# Patient Record
Sex: Female | Born: 1975 | Race: White | Hispanic: No | Marital: Married | Smoking: Never smoker
Health system: Southern US, Community
[De-identification: ages and names within clinical notes are randomized; demographics above are authoritative.]

## PROBLEM LIST (undated history)

## (undated) DIAGNOSIS — Z789 Other specified health status: Secondary | ICD-10-CM

---

## 2003-02-02 ENCOUNTER — Inpatient Hospital Stay (HOSPITAL_COMMUNITY): Admission: AD | Admit: 2003-02-02 | Discharge: 2003-02-05 | Payer: Self-pay | Admitting: Obstetrics and Gynecology

## 2003-03-16 ENCOUNTER — Other Ambulatory Visit: Admission: RE | Admit: 2003-03-16 | Discharge: 2003-03-16 | Payer: Self-pay | Admitting: Obstetrics and Gynecology

## 2010-12-05 ENCOUNTER — Inpatient Hospital Stay (HOSPITAL_COMMUNITY): Admission: AD | Admit: 2010-12-05 | Payer: Self-pay | Source: Home / Self Care | Admitting: Obstetrics and Gynecology

## 2010-12-09 ENCOUNTER — Inpatient Hospital Stay (HOSPITAL_COMMUNITY): Admission: AD | Admit: 2010-12-09 | Payer: Self-pay | Source: Ambulatory Visit | Admitting: Obstetrics and Gynecology

## 2010-12-09 ENCOUNTER — Inpatient Hospital Stay (HOSPITAL_COMMUNITY)
Admission: AD | Admit: 2010-12-09 | Discharge: 2010-12-10 | DRG: 373 | Disposition: A | Discharge status: Home / Self Care | Payer: BC Managed Care – PPO | Source: Ambulatory Visit | Attending: Obstetrics and Gynecology | Admitting: Obstetrics and Gynecology

## 2010-12-09 DIAGNOSIS — O480 Post-term pregnancy: Principal | ICD-10-CM | POA: Diagnosis present

## 2010-12-09 LAB — RPR: RPR Ser Ql: NONREACTIVE

## 2010-12-09 LAB — CBC
HCT: 35.1 % — ABNORMAL LOW (ref 36.0–46.0)
Hemoglobin: 12.1 g/dL (ref 12.0–15.0)
RBC: 3.89 MIL/uL (ref 3.87–5.11)
WBC: 9.7 10*3/uL (ref 4.0–10.5)

## 2010-12-09 LAB — ABO/RH: ABO/RH(D): O POS

## 2010-12-10 LAB — CBC
HCT: 31 % — ABNORMAL LOW (ref 36.0–46.0)
Hemoglobin: 10.5 g/dL — ABNORMAL LOW (ref 12.0–15.0)
MCHC: 33.9 g/dL (ref 30.0–36.0)
RBC: 3.38 MIL/uL — ABNORMAL LOW (ref 3.87–5.11)
WBC: 13.7 10*3/uL — ABNORMAL HIGH (ref 4.0–10.5)

## 2010-12-15 NOTE — H&P (Signed)
  NAMESUZANNAH, Colleen Sanchez                 ACCOUNT NO.:  [id number]  MEDICAL RECORD NO.:  [id number]           PATIENT TYPE:  I  LOCATION:  9127                          FACILITY:  [facility]  PHYSICIAN:  Lenoard Aden, M.D.DATE OF BIRTH:  1976-01-30  DATE OF ADMISSION:  12/09/2010 DATE OF DISCHARGE:                             HISTORY & PHYSICAL   INDICATION FOR INDUCTION:  Postdate, she is a 35 year old Caucasian female G3, P1-0-1-1 at 40-4/7 weeks' gestation who presents for induction.  She has no known drug allergies.  Medications are prenatal vitamins.  She is a nonsmoker and nondrinker.  She denies domestic or physical violence.  She has a personal history of PMDD.  SOCIAL HISTORY:  Noncontributory.  FAMILY HISTORY:  Remarkable for hypertension, uterine cancer, heart disease, diabetes, and lung cancer.  PREVIOUS PREGNANCIES:  Remarkable for SAB in 2003 and then spontaneous vaginal delivery of a 6 pounds 15 ounces female fetus in 2004.  Prenatal course complicated by size discrepancy with AGA fetus on ultrasound, GBS was performed, was negative.  PHYSICAL EXAMINATION:  GENERAL:  She is a well-developed, well-nourished white female in no acute distress. HEENT:  Normal. LUNGS:  Clear. HEART:  Regular rhythm. ABDOMEN:  Soft, gravid, nontender.  Estimated fetal weight 7 pounds. Cervix is 3-4 cm, 80%, vertex -1. EXTREMITIES:  There were no cords. NEUROLOGIC:  Nonfocal. SKIN:  Intact.  NST is reactive.  IMPRESSION:  A 40 and 4/7-week intrauterine pregnancy for induction of labor.  PLAN:  Pitocin epidural as needed, anticipate attempts at vaginal delivery.     Lenoard Aden, M.D.     RJT/MEDQ  D:  12/09/2010  T:  12/09/2010  Job:  [id number]  Electronically Signed by Olivia Mackie M.D. on 12/15/2010 11:14:43 AM

## 2011-03-09 NOTE — H&P (Signed)
NAME:  Colleen Sanchez, Colleen Sanchez                           ACCOUNT NO.:  [id number]   MEDICAL RECORD NO.:  [id number]                   PATIENT TYPE:  INP   LOCATION:  9162                                 FACILITY:  [facility]   PHYSICIAN:  Genia Del, M.D.             DATE OF BIRTH:  [birth year]   DATE OF ADMISSION:  02/02/2003  DATE OF DISCHARGE:                                HISTORY & PHYSICAL   IDENTIFICATION DATA:  The patient is a 35 year old G3, P0, A2, at [redacted] weeks  gestation.  Expected date of delivery is 02/02/03.   REASON FOR ADMISSION:  Suspected labor with regular intense uterine  contractions and very severe pelvic pressure.   HISTORY OF PRESENT ILLNESS:  The patient calls complaining of very intense  pain and pressure in her pelvis with probable uterine contractions.  No  vaginal bleeding, no fluid leak at that time.  Fetal movement, no PIH  symptoms.   PAST MEDICAL HISTORY:  Negative.   PAST SURGICAL HISTORY:  1. Positive for a D&E.  2. In 1998, 10 weeks therapeutic abortion with no complications.  3. In 4/03, 11 weeks spontaneous abortion of twins, no complications.   ALLERGIES:  No known drug allergies.   MEDICATIONS:  Prenatal vitamins.   SOCIAL HISTORY:  Married, nonsmoker.   FAMILY HISTORY:  Positive for cardiovascular disease, diverticulosis, and  chronic hypertension.   HISTORY OF PRESENT PREGNANCY:  First trimester was normal with labs showing  a hemoglobin of 12.7, platelets 245, O positive, antibodies negative, RPR  nonreactive, Hepatitis B surface antigen negative, HIV nonreactive, rubella  titer immune.  Gonorrhea and Chlamydia negative.  Pap was normal.  In the  second trimester she had a triple test which was within normal limits.  She  had an ultrasound revealing an estimated age of 67 weeks which was within  normal limits.  Placenta was anterior normal.  Amniotic fluid was normal.  In the third trimester one hour GTT was normal at 86.  Blood  pressures  remained normal throughout pregnancy.  Weight gain was adequate.  Group B  Strep was done at 35+ weeks and was negative.   REVIEW OF SYMPTOMS:  CONSTITUTIONAL:  Negative.  HEENT:  Negative.  CARDIOVASCULAR:  Negative.  RESPIRATORY:  Negative.  GASTROINTESTINAL:  Negative.  GENITOURINARY:  Negative.  NEUROLOGIC:  Negative.   PHYSICAL EXAMINATION:  GENERAL:  The patient is in no acute distress, but  severe pain with uterine contractions.  VITAL SIGNS:  Blood pressure 130/74,  pulse 80, respiratory rate 20, temperature 96.9.  LUNGS:  Clear bilaterally.  HEART:  Regular rate and rhythm.  ABDOMEN:  Gravid, regular presentation.  PELVIC:  Vaginal examination on admission was 8 to 9 cm, 90% effaced,  vertex, 0 to +1.  Membranes grossly ruptured.  EXTREMITIES:  Lower limbs  normal.   LABORATORY DATA:  Reassuring fetal heart rate in 130's with good  variability.  Mild variables with uterine contractions.  Uterine  contractions are every 1 minute, intense, lasting 60 to 70 seconds.   IMPRESSION:  G3, P0, A2 at [redacted] weeks gestation in continuous active phase of  labor.  Fetal heart rate being reassuring, Group B Strep negative.   PLAN:  Admit to labor and delivery, prepare for vacuum stage, fetal heart  rate monitoring.                                               Genia Del, M.D.    ML/MEDQ  D:  02/02/2003  T:  02/02/2003  Job:  [id number]

## 2012-08-31 ENCOUNTER — Emergency Department: Payer: Self-pay | Admitting: Internal Medicine

## 2012-08-31 LAB — BASIC METABOLIC PANEL
BUN: 16 mg/dL (ref 7–18)
Chloride: 108 mmol/L — ABNORMAL HIGH (ref 98–107)
EGFR (Non-African Amer.): >60
Glucose: 85 mg/dL (ref 65–99)
Potassium: 3.8 mmol/L (ref 3.5–5.1)
Sodium: 140 mmol/L (ref 136–145)

## 2012-08-31 LAB — CBC
HCT: 39.7 % (ref 35.0–47.0)
HGB: 13.9 g/dL (ref 12.0–16.0)
MCHC: 34.9 g/dL (ref 32.0–36.0)

## 2012-08-31 LAB — CK TOTAL AND CKMB (NOT AT ARMC)
CK, Total: 120 U/L (ref 21–215)
CK-MB: 1.1 ng/mL (ref 0.5–3.6)

## 2012-08-31 LAB — TROPONIN I: Troponin-I: <0.02 ng/mL

## 2012-08-31 LAB — TSH: Thyroid Stimulating Horm: 1.73 u[IU]/mL

## 2012-08-31 IMAGING — CR DG CHEST 2V
1 series · 3 of 3 positions shown · non-contrast
Comparison: none

REASON FOR EXAM: palpitations
COMMENTS:

PROCEDURE:     DXR - DXR CHEST PA (OR AP) AND LATERAL  - [DATE]  [DATE]
RESULT:     The lungs are hyperinflated and clear. The cardiac silhouette is
normal in size. The mediastinum is normal in width. There is no pleural
effusion. The bony thorax exhibits no acute abnormality.

[Series 1: pa · 0.17mm/px · 3 of 3 slices shown]
[im 1/3]
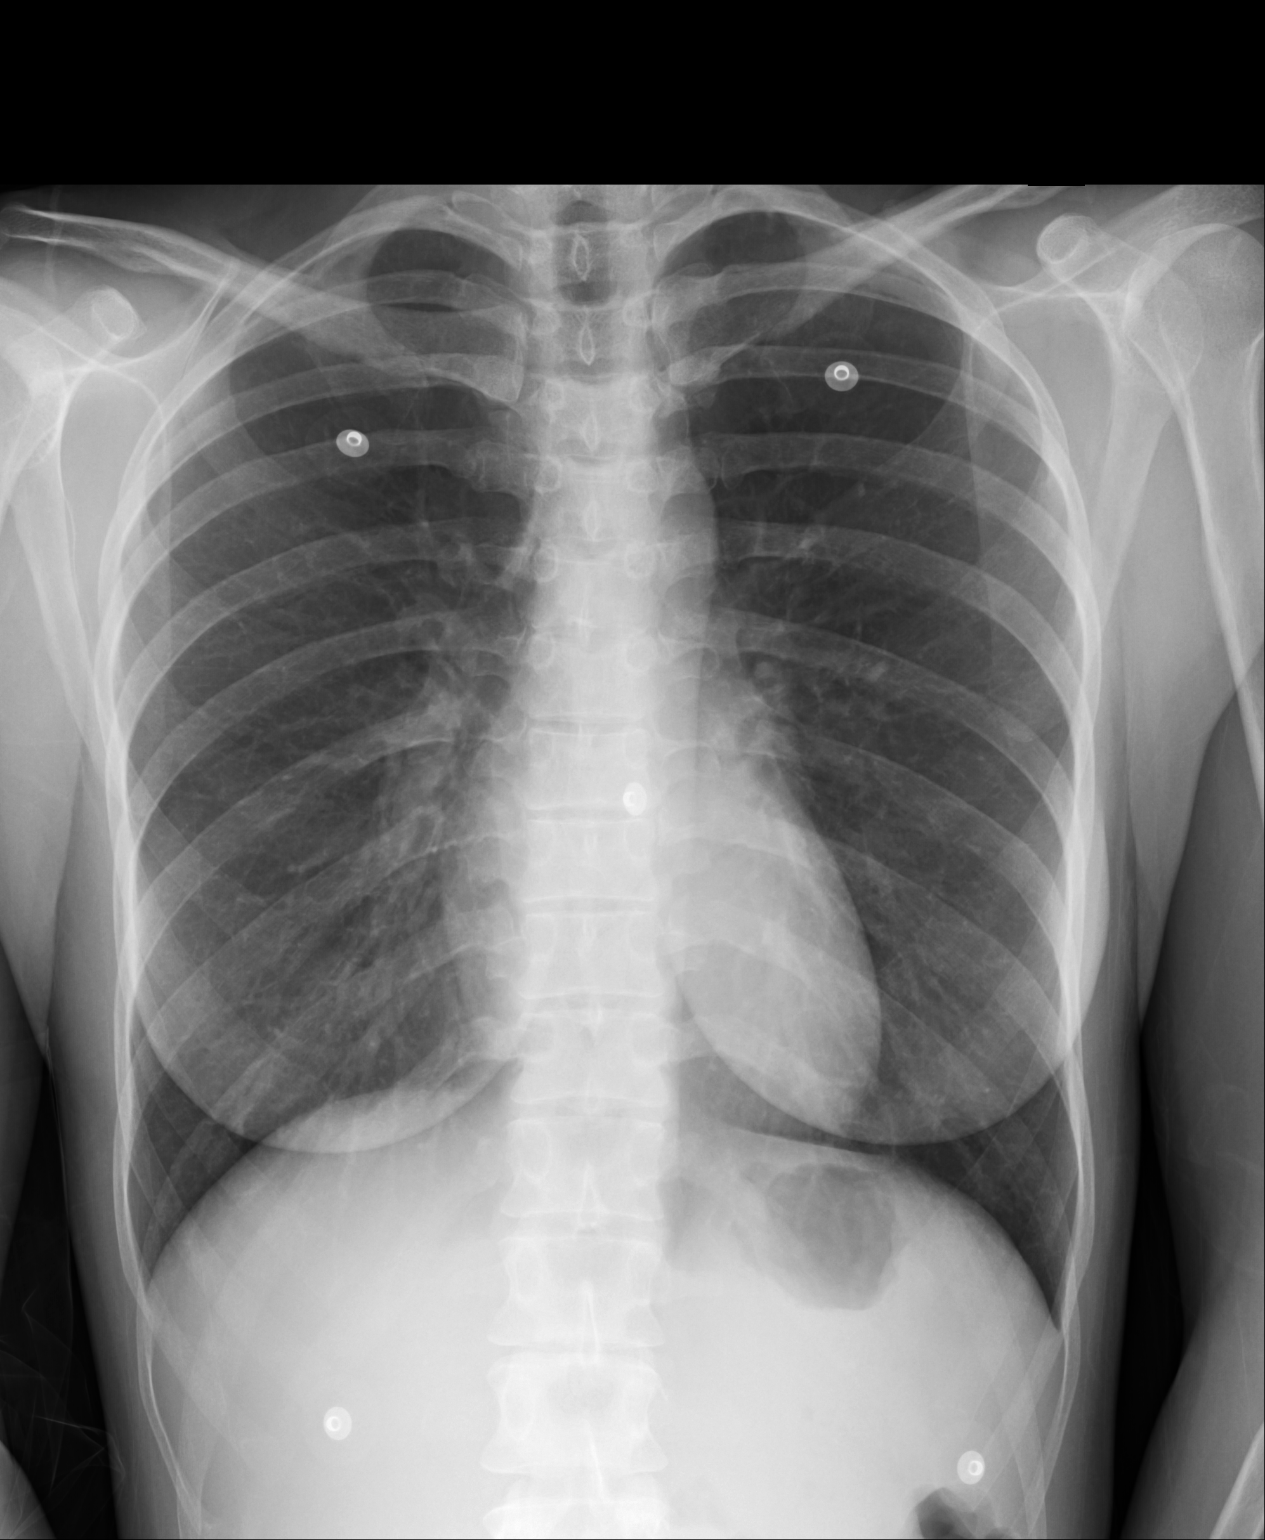
[im 2/3]
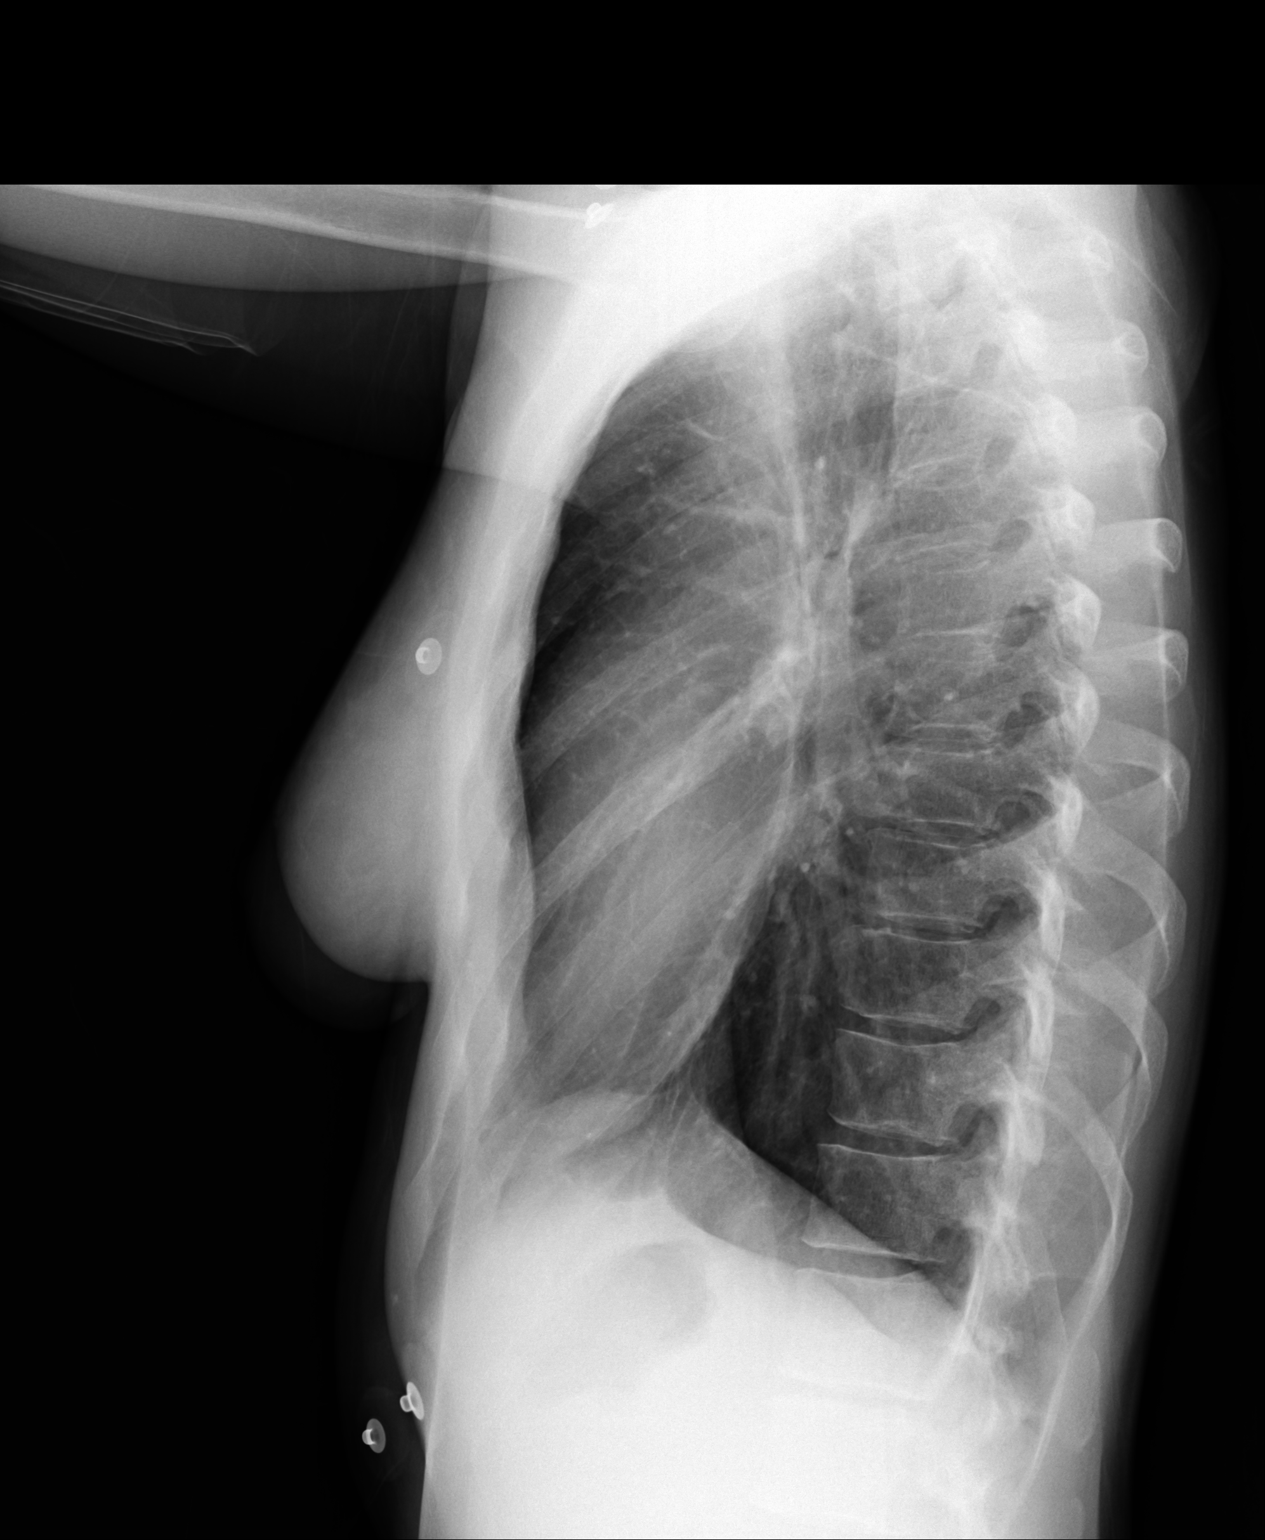
[im 3/3]
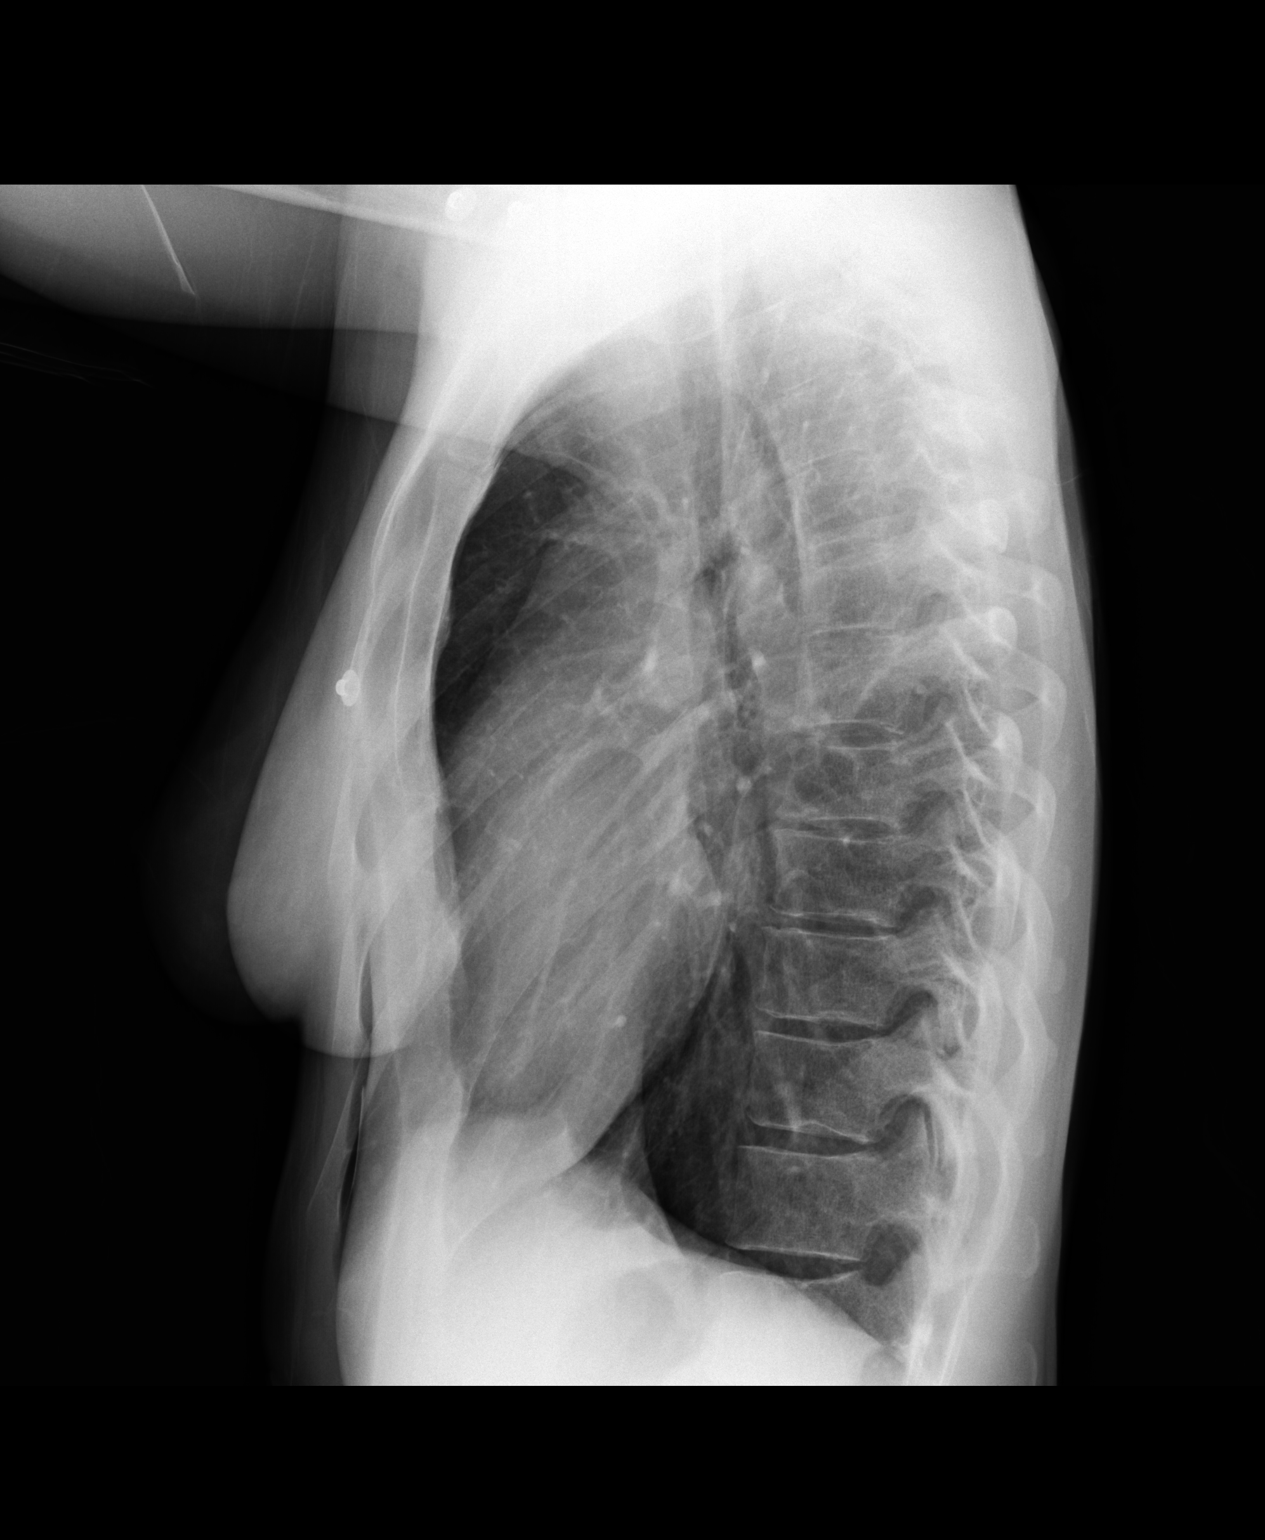

[3 of 3 positions shown; findings below may reference images not displayed]

IMPRESSION: There is hyperinflation which suggests reactive airway
disease. There is no evidence of a pneumothorax nor of pneumonia nor CHF.

[REDACTED]

## 2019-11-17 ENCOUNTER — Ambulatory Visit: Payer: BC Managed Care – PPO | Attending: Internal Medicine

## 2019-11-17 DIAGNOSIS — Z20822 Contact with and (suspected) exposure to covid-19: Secondary | ICD-10-CM

## 2019-11-18 LAB — NOVEL CORONAVIRUS, NAA: SARS-CoV-2, NAA: NOT DETECTED

## 2020-03-30 ENCOUNTER — Other Ambulatory Visit: Payer: Self-pay

## 2020-03-30 ENCOUNTER — Encounter: Payer: Self-pay | Admitting: *Deleted

## 2020-03-30 ENCOUNTER — Emergency Department: Payer: BC Managed Care – PPO

## 2020-03-30 DIAGNOSIS — I671 Cerebral aneurysm, nonruptured: Secondary | ICD-10-CM | POA: Diagnosis not present

## 2020-03-30 DIAGNOSIS — G935 Compression of brain: Secondary | ICD-10-CM | POA: Diagnosis not present

## 2020-03-30 DIAGNOSIS — R4701 Aphasia: Secondary | ICD-10-CM | POA: Diagnosis not present

## 2020-03-30 DIAGNOSIS — Z20822 Contact with and (suspected) exposure to covid-19: Secondary | ICD-10-CM | POA: Diagnosis not present

## 2020-03-30 DIAGNOSIS — G950 Syringomyelia and syringobulbia: Secondary | ICD-10-CM | POA: Insufficient documentation

## 2020-03-30 DIAGNOSIS — E876 Hypokalemia: Secondary | ICD-10-CM | POA: Diagnosis not present

## 2020-03-30 DIAGNOSIS — R519 Headache, unspecified: Secondary | ICD-10-CM | POA: Diagnosis not present

## 2020-03-30 DIAGNOSIS — R410 Disorientation, unspecified: Principal | ICD-10-CM | POA: Insufficient documentation

## 2020-03-30 DIAGNOSIS — R29818 Other symptoms and signs involving the nervous system: Secondary | ICD-10-CM | POA: Insufficient documentation

## 2020-03-30 LAB — CBC
HCT: 37 % (ref 36.0–46.0)
Hemoglobin: 13 g/dL (ref 12.0–15.0)
MCH: 31.6 pg (ref 26.0–34.0)
MCHC: 35.1 g/dL (ref 30.0–36.0)
MCV: 90 fL (ref 80.0–100.0)
Platelets: 249 10*3/uL (ref 150–400)
RBC: 4.11 MIL/uL (ref 3.87–5.11)
RDW: 12.1 % (ref 11.5–15.5)
WBC: 9.4 10*3/uL (ref 4.0–10.5)
nRBC: 0 % (ref 0.0–0.2)

## 2020-03-30 LAB — POCT PREGNANCY, URINE: Preg Test, Ur: NEGATIVE

## 2020-03-30 LAB — URINALYSIS, COMPLETE (UACMP) WITH MICROSCOPIC
Bilirubin Urine: NEGATIVE
Glucose, UA: NEGATIVE mg/dL
Ketones, ur: NEGATIVE mg/dL
Nitrite: NEGATIVE
Protein, ur: NEGATIVE mg/dL
Specific Gravity, Urine: 1.003 — ABNORMAL LOW (ref 1.005–1.030)
pH: 6 (ref 5.0–8.0)

## 2020-03-30 LAB — BASIC METABOLIC PANEL
Anion gap: 8 (ref 5–15)
BUN: 17 mg/dL (ref 6–20)
CO2: 27 mmol/L (ref 22–32)
Calcium: 9.2 mg/dL (ref 8.9–10.3)
Chloride: 105 mmol/L (ref 98–111)
Creatinine, Ser: 0.76 mg/dL (ref 0.44–1.00)
GFR calc Af Amer: >60 mL/min (ref >60)
GFR calc non Af Amer: >60 mL/min (ref >60)
Glucose, Bld: 91 mg/dL (ref 70–99)
Potassium: 3 mmol/L — ABNORMAL LOW (ref 3.5–5.1)
Sodium: 140 mmol/L (ref 135–145)

## 2020-03-30 IMAGING — CT CT HEAD W/O CM
3 series · 15 of 47 positions shown, 18 images · non-contrast
Comparison: None.

CLINICAL DATA: Acute headache with normal neuro exam.

EXAM:
CT HEAD WITHOUT CONTRAST
TECHNIQUE: Contiguous axial images were obtained from the base of the skull
through the vertex without intravenous contrast.

[Series 2: head wo · axial · 0.43mm/px · z∈[-177,-52]mm · 9 of 30 slices shown, 12 images]
[im 3/30  brain]
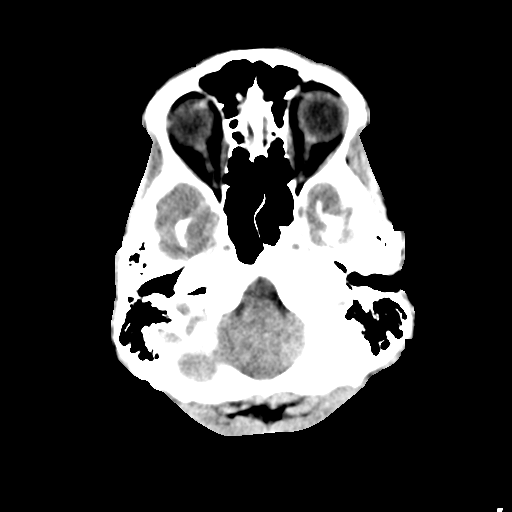
[im 3/30  bone]
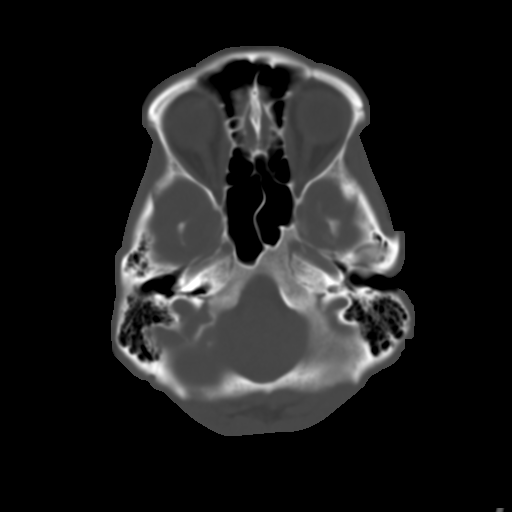
[im 6/30  brain]
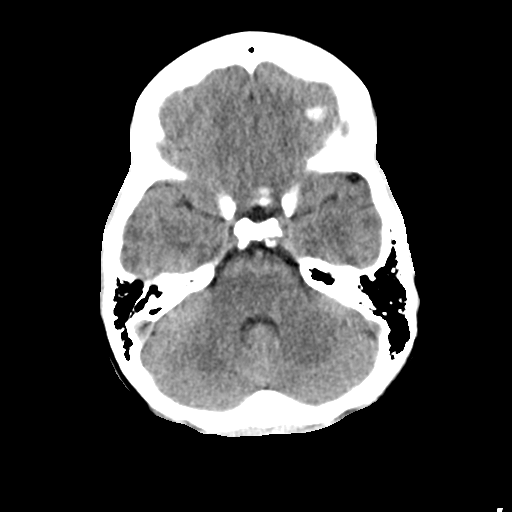
[im 9/30  brain]
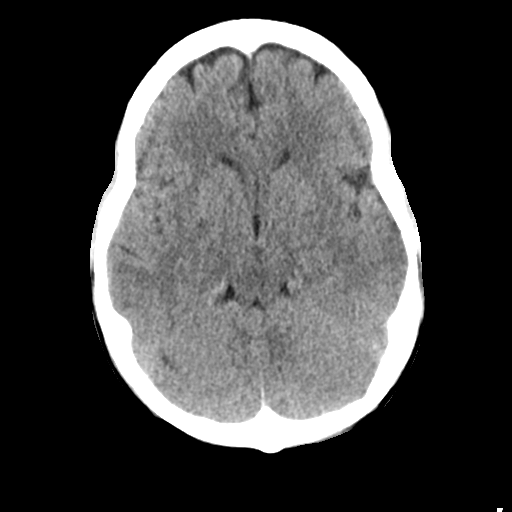
[im 12/30  brain]
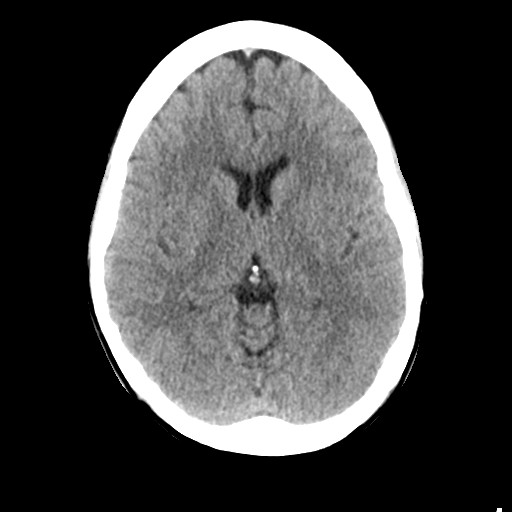
[im 16/30  brain]
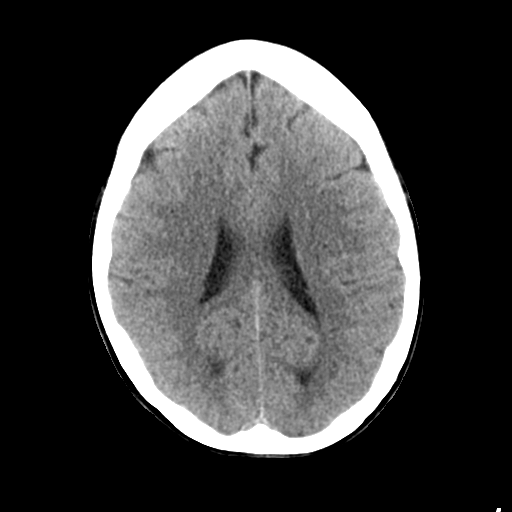
[im 16/30  bone]
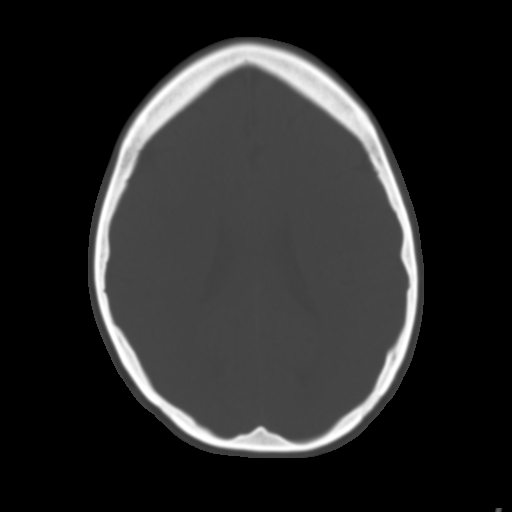
[im 19/30  brain]
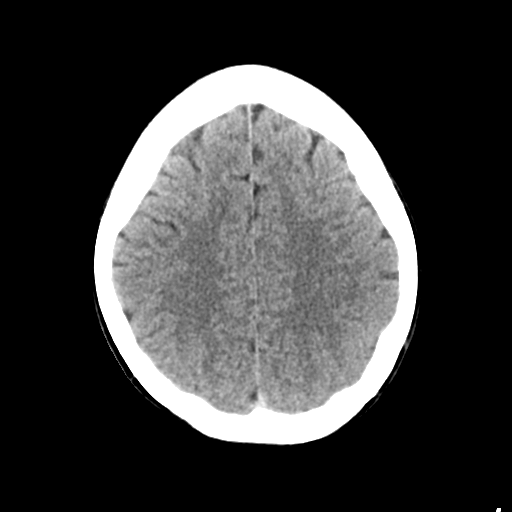
[im 22/30  brain]
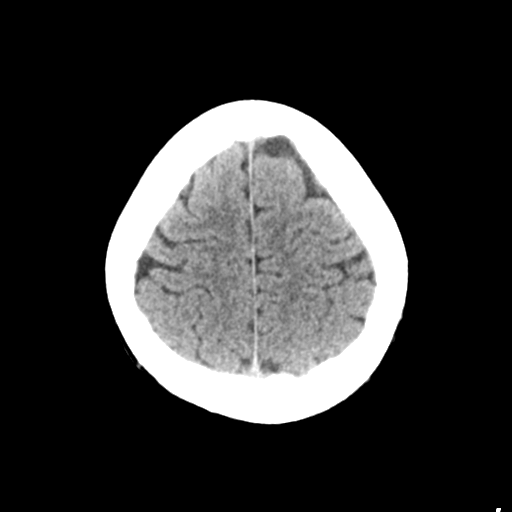
[im 25/30  brain]
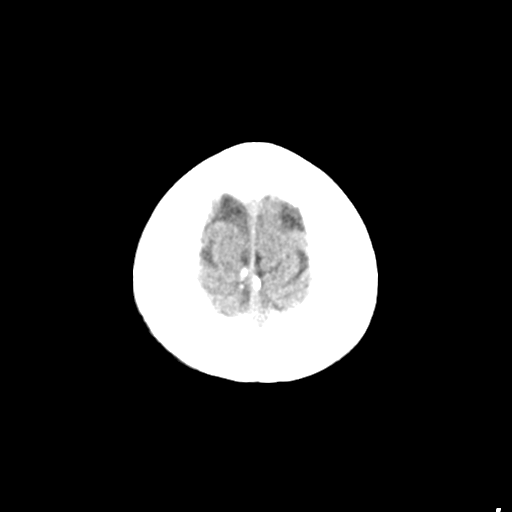
[im 28/30  brain]
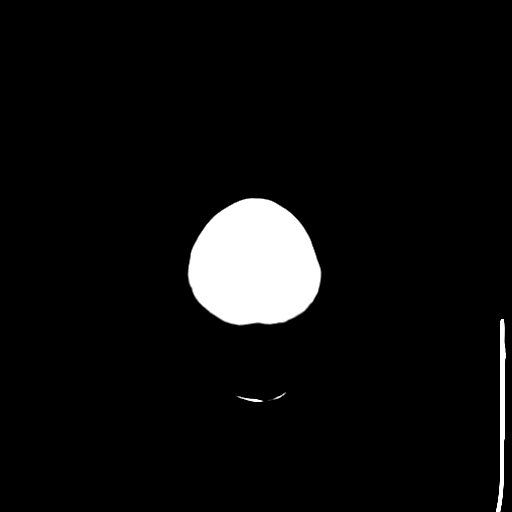
[im 28/30  bone]
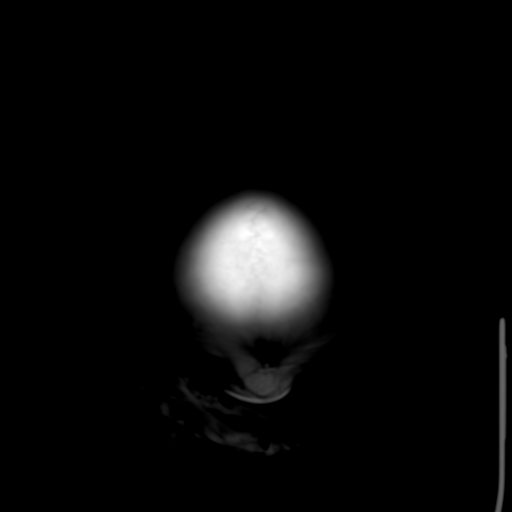

[Series 4: coronal soft tissue · coronal · 0.31mm/px · 3 of 65 slices shown]
[im 22/65  brain]
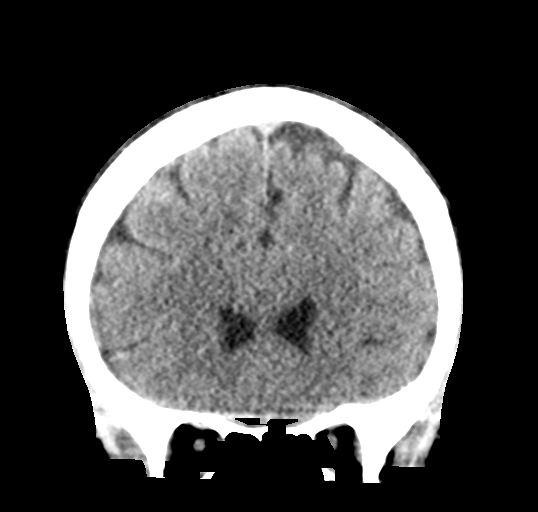
[im 29/65  brain]
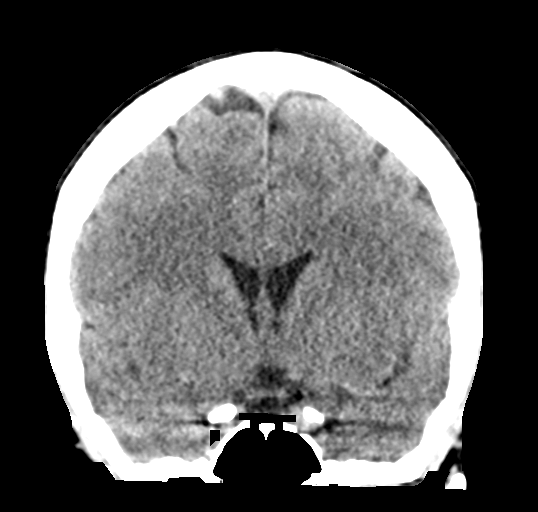
[im 36/65  brain]
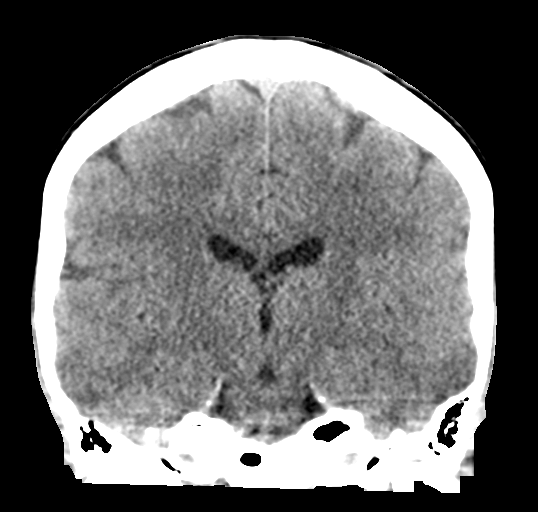

[Series 5: sagittal soft tissue · sagittal · 0.31mm/px · 3 of 51 slices shown]
[im 17/51  brain]
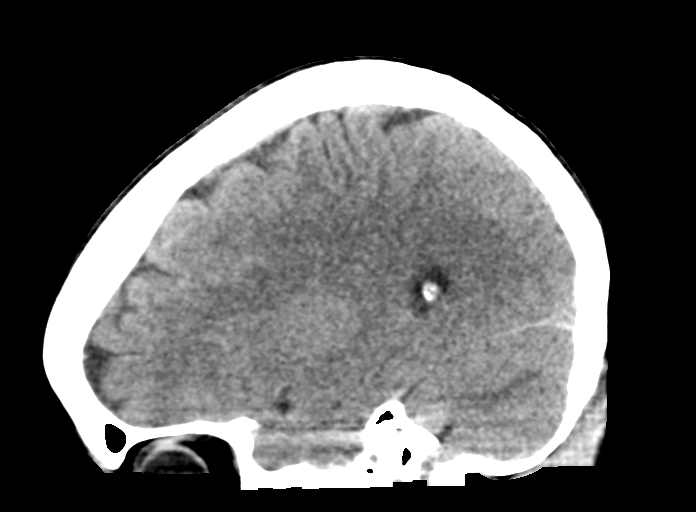
[im 26/51  brain]
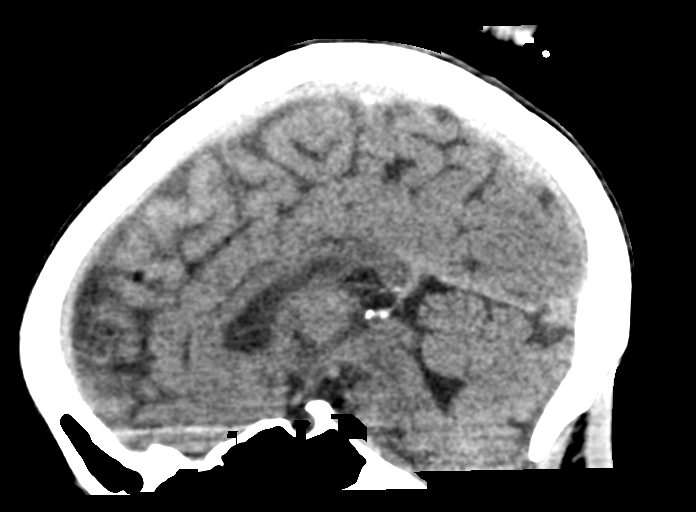
[im 34/51  brain]
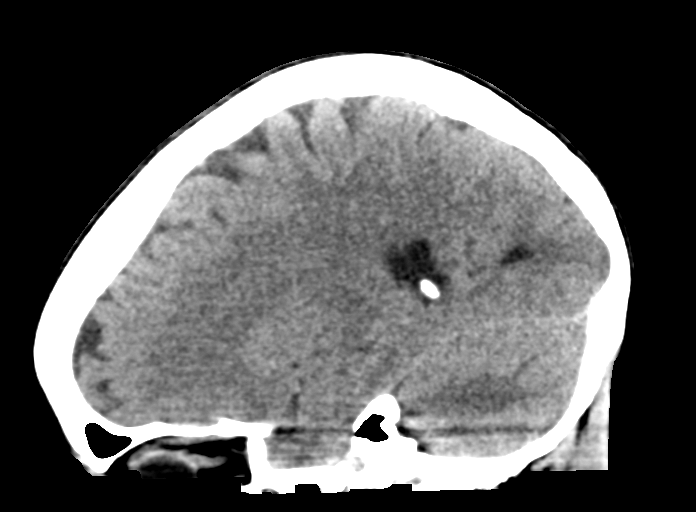

[15 of 47 positions shown; findings below may reference images not displayed]

FINDINGS: Brain: Low lying cerebellar tonsils with crowding of the foramen
magnum. Inferior extent not entirely included in the field of view.
No intracranial hemorrhage, mass effect, or midline shift. No
hydrocephalus. No evidence of territorial infarct or acute ischemia.
No extra-axial or intracranial fluid collection.

Vascular: No hyperdense vessel or unexpected calcification.

Skull: No fracture or focal lesion.

Sinuses/Orbits: Paranasal sinuses and mastoid air cells are clear.
The visualized orbits are unremarkable.

Other: None.
IMPRESSION: 1. No acute intracranial abnormality.
2. Low lying cerebellar tonsils with crowding of the foramen magnum,
may be normal variant or can be seen with Chiari I malformation.
Recommend nonemergent MRI for further evaluation.

## 2020-03-30 NOTE — ED Notes (Signed)
poct pregnancy Negative 

## 2020-03-30 NOTE — ED Triage Notes (Addendum)
Pt ambulatory to triage.  Pt reports a headache since 1900 tonight.  No n/v/d.  Pt states I can't make sense out of anything tonight.  Pt  Reports she has difficulty finding words she wants to say.  Pt tearful.  Pt alert  Speech clear. Husband states pt worked outside in the heat today.

## 2020-03-31 ENCOUNTER — Encounter: Payer: Self-pay | Admitting: Internal Medicine

## 2020-03-31 ENCOUNTER — Observation Stay
Admission: RE | Admit: 2020-03-31 | Discharge: 2020-04-01 | Disposition: A | Discharge status: Home / Self Care | Payer: BC Managed Care – PPO | Attending: Internal Medicine | Admitting: Internal Medicine

## 2020-03-31 ENCOUNTER — Emergency Department: Payer: BC Managed Care – PPO

## 2020-03-31 ENCOUNTER — Observation Stay (HOSPITAL_BASED_OUTPATIENT_CLINIC_OR_DEPARTMENT_OTHER)
Admit: 2020-03-31 | Discharge: 2020-03-31 | Disposition: A | Discharge status: Home / Self Care | Payer: BC Managed Care – PPO | Attending: Internal Medicine | Admitting: Internal Medicine

## 2020-03-31 DIAGNOSIS — R29818 Other symptoms and signs involving the nervous system: Secondary | ICD-10-CM

## 2020-03-31 DIAGNOSIS — I671 Cerebral aneurysm, nonruptured: Secondary | ICD-10-CM

## 2020-03-31 DIAGNOSIS — R410 Disorientation, unspecified: Principal | ICD-10-CM

## 2020-03-31 DIAGNOSIS — R519 Headache, unspecified: Secondary | ICD-10-CM | POA: Diagnosis not present

## 2020-03-31 DIAGNOSIS — E876 Hypokalemia: Secondary | ICD-10-CM

## 2020-03-31 DIAGNOSIS — G935 Compression of brain: Secondary | ICD-10-CM

## 2020-03-31 DIAGNOSIS — I6389 Other cerebral infarction: Secondary | ICD-10-CM | POA: Diagnosis not present

## 2020-03-31 HISTORY — DX: Other specified health status: Z78.9

## 2020-03-31 LAB — ECHOCARDIOGRAM COMPLETE

## 2020-03-31 LAB — HIV ANTIBODY (ROUTINE TESTING W REFLEX): HIV Screen 4th Generation wRfx: NONREACTIVE

## 2020-03-31 LAB — SARS CORONAVIRUS 2 BY RT PCR (HOSPITAL ORDER, PERFORMED IN ~~LOC~~ HOSPITAL LAB): SARS Coronavirus 2: NEGATIVE

## 2020-03-31 LAB — HEPATIC FUNCTION PANEL
ALT: 23 U/L (ref 0–44)
AST: 24 U/L (ref 15–41)
Albumin: 4.4 g/dL (ref 3.5–5.0)
Alkaline Phosphatase: 51 U/L (ref 38–126)
Bilirubin, Direct: <0.1 mg/dL (ref 0.0–0.2)
Total Bilirubin: 0.6 mg/dL (ref 0.3–1.2)
Total Protein: 7.1 g/dL (ref 6.5–8.1)

## 2020-03-31 LAB — CK: Total CK: 199 U/L (ref 38–234)

## 2020-03-31 IMAGING — MR MR HEAD W/O CM
11 of 12 series · 38 of 48 positions shown · non-contrast
Comparison: None.

CLINICAL DATA: Headaches and word-finding difficulty.

EXAM:
MRI HEAD WITHOUT CONTRAST
MRA HEAD WITHOUT CONTRAST
TECHNIQUE: Multiplanar, multiecho pulse sequences of the brain and surrounding
structures were obtained without intravenous contrast. Angiographic
images of the head were obtained using MRA technique without
contrast.

[Series 5: ax dwi_tracew · axial · 3.0mm · 0.60mm/px · z∈[-143,+1]mm · 5 of 100 slices shown]
[im 1/100]
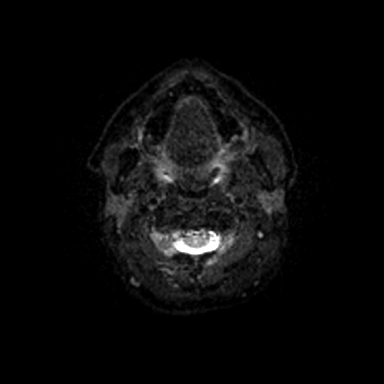
[im 25/100]
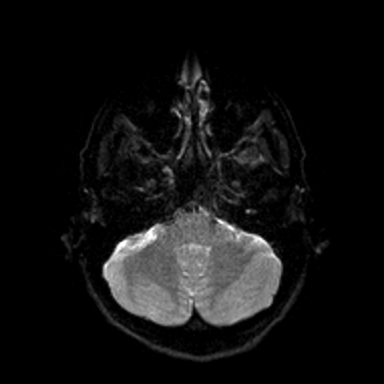
[im 50/100]
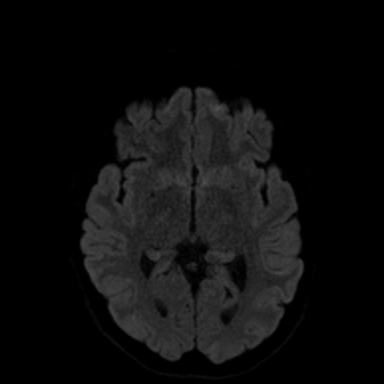
[im 75/100]
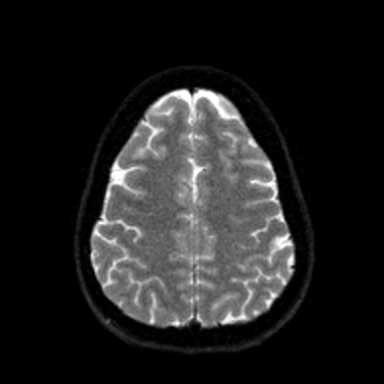
[im 100/100]
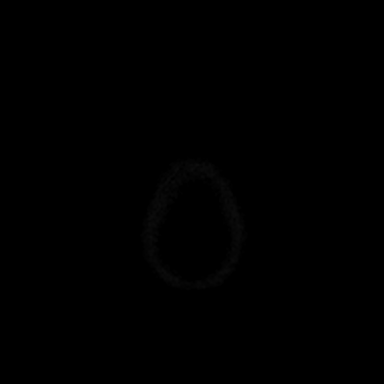

[Series 6: ax dwi_adc · axial · 3.0mm · 0.60mm/px · z∈[-143,+1]mm · 3 of 50 slices shown]
[im 1/50]
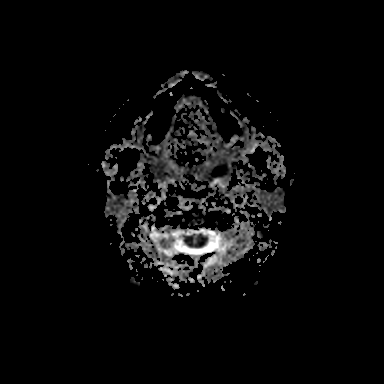
[im 25/50]
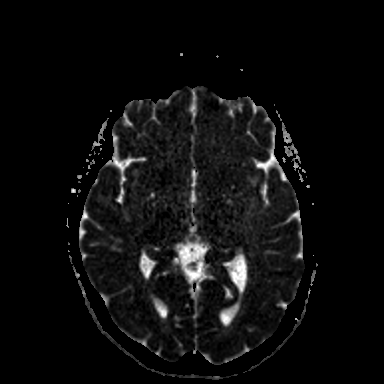
[im 50/50]
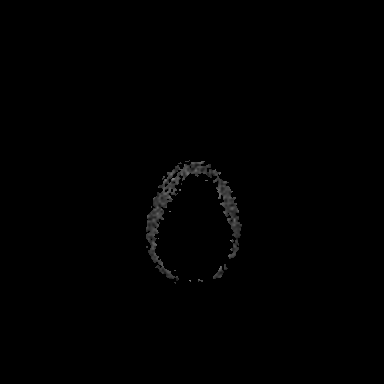

[Series 7: cor dwi_tracew · coronal · 5.0mm · 0.60mm/px · 4 of 80 slices shown]
[im 1/80]
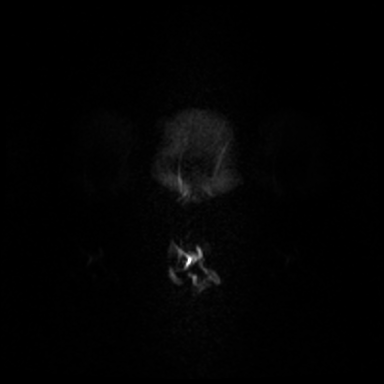
[im 27/80]
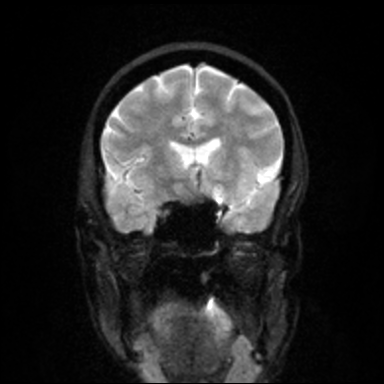
[im 53/80]
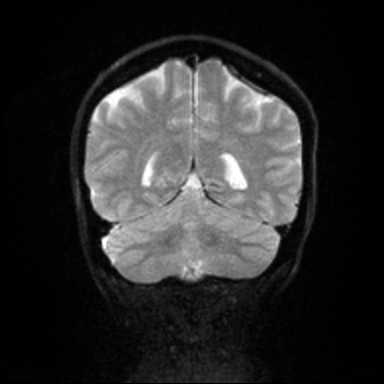
[im 80/80]
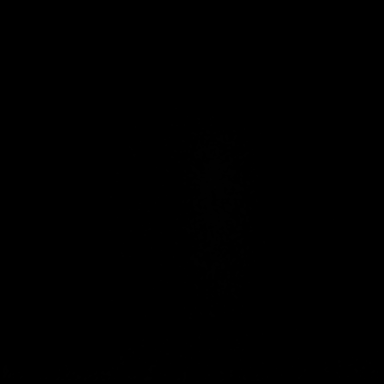

[Series 8: cor dwi_adc · coronal · 5.0mm · 0.60mm/px · 2 of 39 slices shown]
[im 1/39]
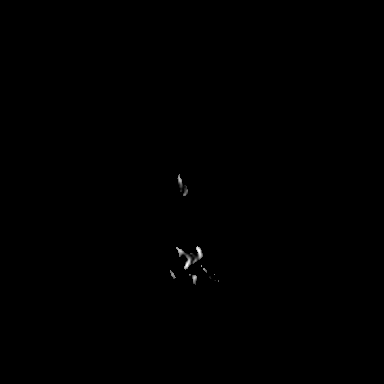
[im 39/39]
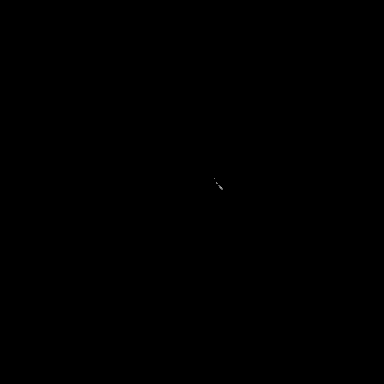

[Series 14: T1 · sagittal · 5.0mm · 0.62mm/px · 1 of 23 slices shown (1 of 2)]
[im 1/23]
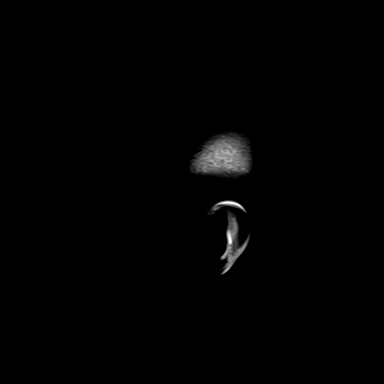

[Series 15: T2 · axial · 5.0mm · 0.53mm/px · z∈[-171,+7]mm · 2 of 34 slices shown (1 of 2)]
[im 1/34]
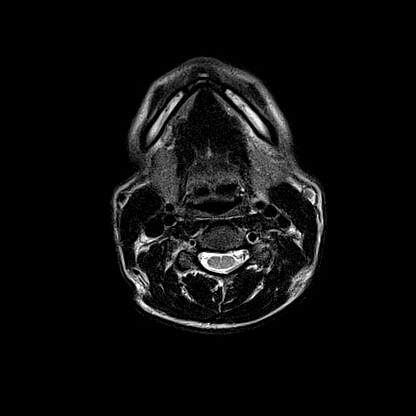
[im 34/34]
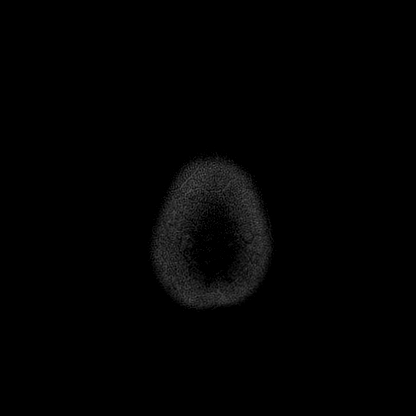

[Series 17: pha_images · axial · 3.0mm · 0.90mm/px · z∈[-173,+8]mm · 3 of 68 slices shown]
[im 1/68]
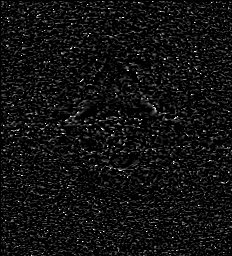
[im 34/68]
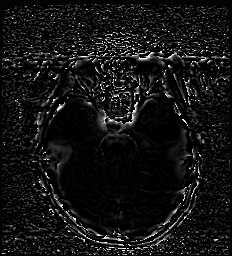
[im 68/68]
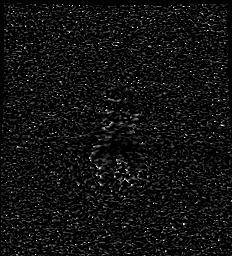

[Series 18: swi_images · axial · 3.0mm · 0.90mm/px · z∈[-173,+19]mm · 4 of 72 slices shown]
[im 1/72]
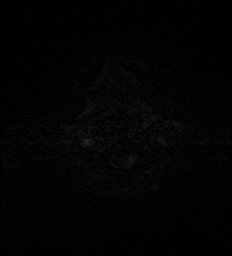
[im 24/72]
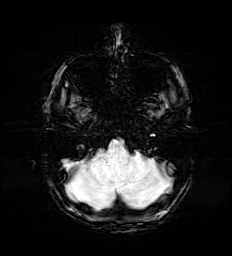
[im 48/72]
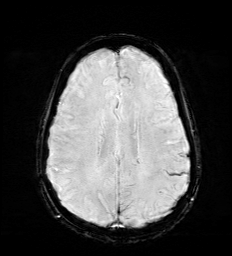
[im 72/72]
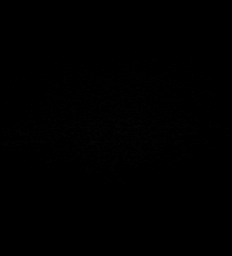

[Series 21: T1 · axial · 1.0mm · 0.98mm/px · z∈[-158,+10]mm · 10 of 192 slices shown (2 of 2)]
[im 1/192]
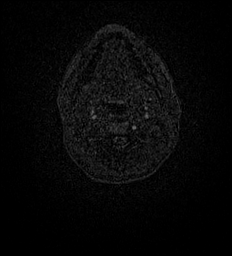
[im 22/192]
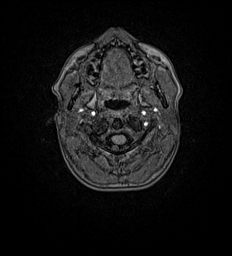
[im 43/192]
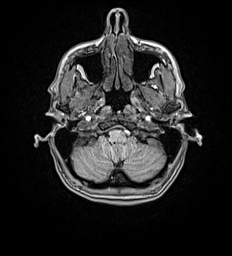
[im 64/192]
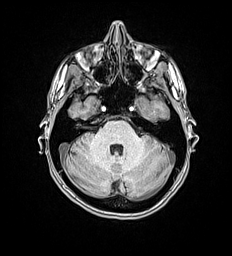
[im 85/192]
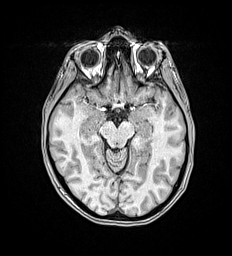
[im 107/192]
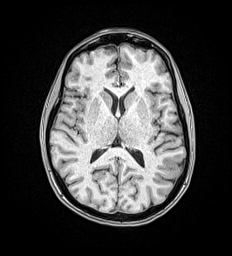
[im 128/192]
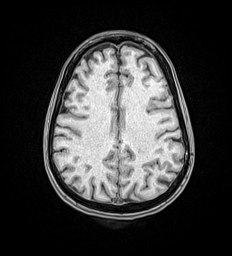
[im 149/192]
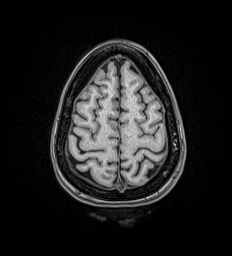
[im 170/192]
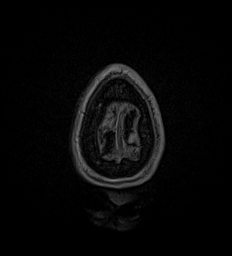
[im 192/192]
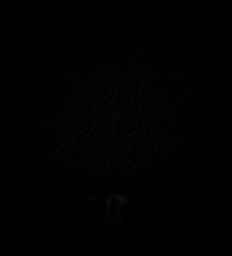

[Series 22: FLAIR · axial · 3.0mm · 0.53mm/px · z∈[-170,+15]mm · 3 of 58 slices shown]
[im 1/58]
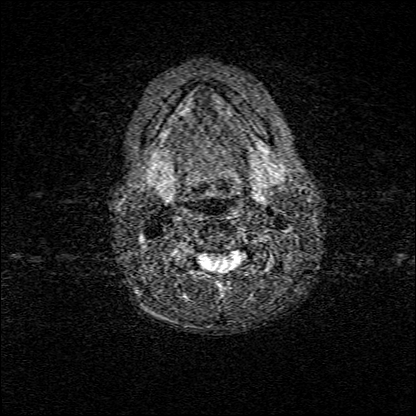
[im 29/58]
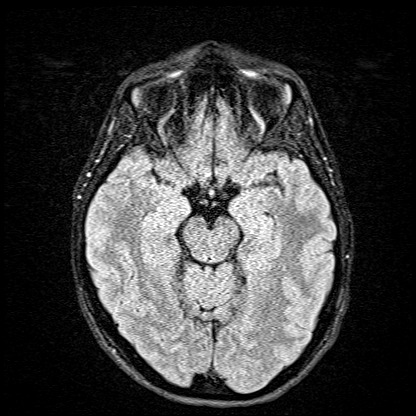
[im 58/58]
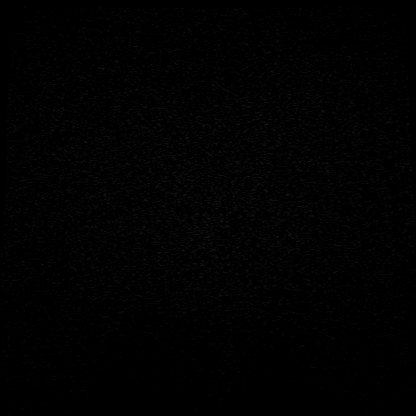

[Series 23: T2 · coronal · 5.0mm · 0.57mm/px · 1 of 29 slices shown (2 of 2)]
[im 1/29]
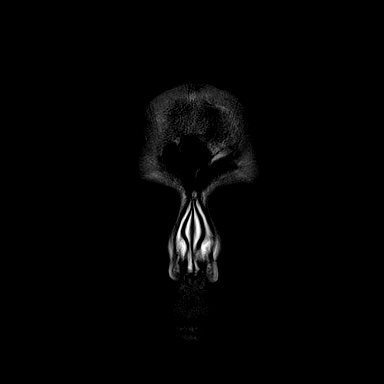

[38 of 48 positions shown; findings below may reference images not displayed]

FINDINGS: MRI HEAD FINDINGS

BRAIN: There is no acute infarct, acute hemorrhage or extra-axial
collection. The white matter signal is normal for the patient's age.
The cerebral and cerebellar volume are age-appropriate. There is no
hydrocephalus. Blood-sensitive sequences show no chronic
microhemorrhage or superficial siderosis. Cerebellar tonsils extend
14 mm below the foramen magnum.

SKULL AND UPPER CERVICAL SPINE: There is a syrinx of the upper
cervical spine measuring 4 mm in transverse dimension.

SINUSES/ORBITS: No fluid levels or advanced mucosal thickening. No
mastoid or middle ear effusion. The orbits are normal.

MRA HEAD FINDINGS

POSTERIOR CIRCULATION:

--Basilar artery: Normal.

--Posterior cerebral arteries: Normal. Both originate from the
basilar artery.

--Superior cerebellar arteries: Normal.

--Inferior cerebellar arteries: Normal anterior and posterior
inferior cerebellar arteries.

ANTERIOR CIRCULATION:

--Intracranial internal carotid arteries: There is a laterally
projecting aneurysm of the left ICA ophthalmic segment measuring 3
mm at its base and 5 mm in widest dimension.

--Anterior cerebral arteries: Normal. Both A1 segments are present.
Patent anterior communicating artery.

--Middle cerebral arteries: Normal.

--Posterior communicating arteries: Present on the left, absent on
the right.
IMPRESSION: 1. Chiari 1 malformation with upper cervical syrinx.
2. 3x5 mm left ICA ophthalmic segment ICA aneurysm.
3. No acute abnormality.

## 2020-03-31 MED ORDER — SODIUM CHLORIDE 0.9 % IV BOLUS
1000.0000 mL | Freq: Once | INTRAVENOUS | Status: AC
  Administered 2020-03-31: 1000 mL via INTRAVENOUS

## 2020-03-31 MED ORDER — POTASSIUM CHLORIDE CRYS ER 20 MEQ PO TBCR
40.0000 meq | EXTENDED_RELEASE_TABLET | Freq: Once | ORAL | Status: AC
  Administered 2020-03-31: 40 meq via ORAL
  Filled 2020-03-31: qty 2

## 2020-03-31 MED ORDER — KETOROLAC TROMETHAMINE 30 MG/ML IJ SOLN
30.0000 mg | Freq: Once | INTRAMUSCULAR | Status: AC
  Administered 2020-03-31: 30 mg via INTRAVENOUS
  Filled 2020-03-31: qty 1

## 2020-03-31 MED ORDER — MORPHINE SULFATE (PF) 4 MG/ML IV SOLN
4.0000 mg | Freq: Once | INTRAVENOUS | Status: AC
  Administered 2020-03-31: 4 mg via INTRAVENOUS
  Filled 2020-03-31: qty 1

## 2020-03-31 MED ORDER — STROKE: EARLY STAGES OF RECOVERY BOOK: Freq: Once | Status: DC

## 2020-03-31 MED ORDER — SODIUM CHLORIDE 0.9 % IV SOLN: INTRAVENOUS | Status: DC

## 2020-03-31 MED ORDER — ACETAMINOPHEN 325 MG PO TABS
650.0000 mg | ORAL_TABLET | ORAL | Status: DC | PRN
  Administered 2020-03-31: 650 mg via ORAL
  Filled 2020-03-31: qty 2

## 2020-03-31 MED ORDER — ONDANSETRON HCL 4 MG/2ML IJ SOLN
INTRAMUSCULAR | Status: AC
  Filled 2020-03-31: qty 2

## 2020-03-31 MED ORDER — MORPHINE SULFATE (PF) 2 MG/ML IV SOLN
INTRAVENOUS | Status: AC
  Administered 2020-03-31: 2 mg via INTRAVENOUS
  Filled 2020-03-31: qty 1

## 2020-03-31 MED ORDER — ACETAMINOPHEN 650 MG RE SUPP: 650.0000 mg | RECTAL | Status: DC | PRN

## 2020-03-31 MED ORDER — ACETAMINOPHEN 160 MG/5ML PO SOLN
650.0000 mg | ORAL | Status: DC | PRN
  Filled 2020-03-31: qty 20.3

## 2020-03-31 NOTE — ED Notes (Signed)
Assigned bed @ [id number], spoke with RN jeannette.

## 2020-03-31 NOTE — ED Notes (Signed)
Patient sitting up in bed talking to family with clear speech on face time, smiling a laughing in room. S/o brought outside food.

## 2020-03-31 NOTE — ED Notes (Signed)
md in to reassess. 

## 2020-03-31 NOTE — ED Notes (Signed)
See paper chart for vitals, assessment, mar.

## 2020-03-31 NOTE — Evaluation (Signed)
Occupational Therapy Evaluation Patient Details Name: Colleen Sanchez MRN: [id number] DOB: [birth year] Today's Date: 03/31/2020    History of Present Illness Colleen Sanchez is a 44 y.o. female with no significant past medical history who was brought to the emergency room earlier today after her husband noticed that she was confused while sitting looking at TV.  He said that she appeared to not understand what was going on. Husband provides most of the history.  He said she was working outdoors in the heat all morning and drinking very cold water and he thinks this might have affected her. Upon presentation, complained of headache and had some vomiting as well.  Head CT showed no acute intracranial abnormality but suggested Chiari I malformation with recommendation for nonemergent MRI.  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   Clinical Impression   Pt seen for OT evaluation this date in setting of acute hospitalization d/t presentation of HA and confusion. Pt, at time of assessment reports HA is resolving-just vaguely lingering. 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 Upon OT assessment this date, pt is noted to be back to functional baseline of Indep with all aspects of self care assessed, Indep with ADL transfers and mobility and Indep with fxl mobility with no AD. No acute OT needs identified, nor is there an anticipated need for OT upon d/c from hospital.     Follow Up Recommendations  No OT follow up    Equipment Recommendations  None recommended by OT    Recommendations for Other Services       Precautions / Restrictions Precautions Precautions: Fall Restrictions Weight Bearing Restrictions: No      Mobility Bed Mobility Overal bed mobility: Independent                Transfers Overall transfer level: Independent                     Balance Overall balance assessment: Independent                                         ADL either performed or assessed with clinical judgement   ADL Overall ADL's : Independent                                             Vision Patient Visual Report: No change from baseline       Perception     Praxis      Pertinent Vitals/Pain Pain Assessment: No/denies pain (vague c/o lingering HA pain, but not significant)     Hand Dominance     Extremity/Trunk Assessment Upper Extremity Assessment Upper Extremity Assessment: Overall WFL for tasks assessed   Lower Extremity Assessment Lower Extremity Assessment: Overall WFL for tasks assessed       Communication Communication Communication: No difficulties   Cognition Arousal/Alertness: Awake/alert Behavior During Therapy: WFL for tasks assessed/performed Overall Cognitive Status: Within Functional Limits for tasks assessed  General Comments: slight groginess, but appropriate t/o, A&O x4   General Comments  Indep for static, dynamic and fxl mobility with no AD    Exercises Other Exercises Other Exercises: OT facilitates education re: role of OT in acute setting. Pt and spouse with good udnerstanding.   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: Decreased activity tolerance      OT Treatment/Interventions:       OT Goals(Current goals can be found in the care plan section) Acute Rehab OT Goals Patient Stated Goal: to go home OT Goal Formulation: All assessment and education complete, DC therapy  OT Frequency:     Barriers to D/C:            Co-evaluation              AM-PAC OT "6 Clicks" Daily Activity     Outcome Measure Help from another person eating meals?: None Help from another person taking care of personal grooming?: None Help from another person toileting, which includes using toliet, bedpan, or urinal?: None Help from another person bathing (including washing, rinsing, drying)?: None Help from another person to put on and taking off regular upper body clothing?: None Help from another person to put on and taking off regular lower body clothing?: None 6 Click Score: 24   End of Session    Activity Tolerance: Patient tolerated treatment well Patient left: in bed;with call bell/phone within reach;with family/visitor present  OT Visit Diagnosis: Other symptoms and signs involving the nervous system (R29.898)                Time: 2706-2376 OT Time Calculation (min): 15 min Charges:  OT General Charges $OT Visit: 1 Visit OT Evaluation $OT Eval Low Complexity: 1 Low  Gerrianne Scale, MS, OTR/L ascom [phone] 03/31/20, 1:32 PM

## 2020-03-31 NOTE — Evaluation (Signed)
Speech Language Pathology Evaluation Patient Details Name: Colleen Sanchez MRN: [id number] DOB: 03-11-1976 Today's Date: 03/31/2020 Time: 2841-3244 SLP Time Calculation (min) (ACUTE ONLY): 15 min  Problem List:  Patient Active Problem List   Diagnosis Date Noted  . Acute headache 03/31/2020  . 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 Acute confusion 03/31/2020  . Acute focal neurological deficit 03/31/2020  . 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 Hypokalemia 03/31/2020  . Headache 03/31/2020   Past Medical History:  Past Medical History:  Diagnosis Date  . Known health problems: none    Past Surgical History: History reviewed. No pertinent surgical history. HPI:  Colleen Sanchez is a 44 y.o. female with no significant past medical history who was brought to the emergency room earlier today after her husband noticed that she was confused while sitting looking at TV.  He said that she appeared to not understand what was going on.  He said he asked so the president was and she was unable to answer. Head CT showed no acute intracranial abnormality but suggested Chiari I malformation with recommendation for nonemergent MRI.  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 As a result, she has word finding deficits that prevent her from communicating her wants/needs and are emotionally distressing. Specifically, pt is not able to perform generative naming tasks, her speech is halting as a result of her word finding deficits. She is aware of these deficits, states that she can picture the word and can initially describe word but is not able to produce the word. As such recommend pt received Outpatient ST services when discharged from the hospital. Pt is a  pre-k teacher and requires skilled ST to target her expressive aphasia.     SLP Assessment  SLP Recommendation/Assessment: Patient does not need any further Speech Lanaguage Pathology Services SLP Visit Diagnosis: Aphasia (R47.01)    Follow Up Recommendations  Outpatient SLP    Frequency and Duration  (N/A)   (N/A)      SLP Evaluation Cognition  Overall Cognitive Status: Within Functional Limits for tasks assessed Arousal/Alertness: Awake/alert Orientation Level: Oriented X4       Comprehension  Auditory Comprehension Overall Auditory Comprehension: Appears within functional limits for tasks assessed Visual Recognition/Discrimination Discrimination: Not tested Reading Comprehension Reading Status: Not tested    Expression Expression Primary Mode of Expression: Verbal Verbal Expression Overall Verbal Expression: Impaired Initiation: No impairment Automatic Speech: Name;Social Response;Month of year;Day of week Level of Generative/Spontaneous Verbalization: Conversation;Sentence Repetition: No impairment Naming: Impairment Responsive: 51-75% accurate Confrontation: Within functional limits Convergent: 75-100% accurate Divergent: 25-49% accurate Verbal Errors: Aware of errors (anomia) Pragmatics: No impairment Written Expression Dominant Hand: Right Written Expression: Not tested   Oral / Motor  Oral Motor/Sensory Function Overall Oral Motor/Sensory Function: Within functional limits Motor Speech Overall Motor Speech: Appears within functional limits for tasks assessed   GO            Shenique Childers B. Dreama Saa M.S., CCC-SLP, [facility] Speech-Language Pathologist Rehabilitation Services Office [phone]         Reuel Derby 03/31/2020, 5:48 PM

## 2020-03-31 NOTE — ED Notes (Signed)
Assumed care of patient reports feeling ok while asleep but when she wakes up continues to have pain/ headache on left side of her head. Denies nausea. Denies photo sensitivity. Patient admitted to [facility] awaiting bed status. Vss. Safety maintained. Call light w/i reach.

## 2020-03-31 NOTE — ED Notes (Signed)
First attempt to call report as per receiving nurse has not had a chance to review chart, nurse stated to writer " I am busy just like your Busy" writer told receiving nurse she will call back in 10 mins, to answer any questions or concerns.

## 2020-03-31 NOTE — H&P (Addendum)
History and Physical    Colleen Sanchez HAL:[id number] DOB: [birth year] DOA: 03/31/2020  PCP: Nira Retort   Patient coming from: Home  I have personally briefly reviewed patient's old medical records in [facility]  Chief Complaint: Headache, confusion  HPI: Colleen Sanchez is a 44 y.o. female with no significant past medical history who was brought to the emergency room earlier today after her husband noticed that she was confused while sitting looking at TV.  He said that she appeared to not understand what was going on.  He said he asked so the president was and she was unable to answer.  He noticed that she had difficulty answering other questions and decided to bring him to the emergency room.  On the way here she developed a headache.  She has no prior history of headaches.  He states that they waited several hours in the waiting room where she vomited 3 times.  He said it appeared that every time she got up after being seated for long time she would vomit.  Husband provides most of the history.  He said she was working outdoors in the heat all morning and drinking very cold water and he thinks this might have affected her.  After several hours waiting to be evaluated in the emergency room there was no significant progression or improvement in her symptoms.  ED Course: Upon arrival at 20:51 vitals were within normal limits.  Blood work unremarkable except for slightly decreased potassium of 3.  NIHSS was 1.  Head CT showed no acute intracranial abnormality but suggested Chiari I malformation with recommendation for nonemergent MRI.  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  Hospitalist consulted for admission. Review of Systems: As per HPI otherwise 10 point review of systems negative.    Past Medical History:  Diagnosis Date  . Known health problems: none     History reviewed. No pertinent  surgical history.   reports that she has never smoked. She has never used smokeless tobacco. She reports previous alcohol use. She reports previous drug use.  No Known Allergies  No family history on file.   Prior to Admission medications   Not on File    Physical Exam: Vitals:   03/31/20 0319 03/31/20 0320 03/31/20 0321 03/31/20 0323  BP:      Pulse: 97 96 93 96  Resp:      Temp:      TempSrc:      SpO2: 100% 99% 98% 98%  Weight:      Height:         Vitals:   03/31/20 0319 03/31/20 0320 03/31/20 0321 03/31/20 0323  BP:      Pulse: 97 96 93 96  Resp:      Temp:      TempSrc:      SpO2: 100% 99% 98% 98%  Weight:      Height:          Constitutional: Alert and oriented x 3 . Not in any apparent distress HEENT:      Head: Normocephalic and atraumatic.         Eyes: PERLA, EOMI, Conjunctivae are normal. Sclera is non-icteric.       Mouth/Throat: Mucous membranes are moist.       Neck: Supple with no signs of meningismus. Cardiovascular: Regular rate and rhythm. No murmurs, gallops, or  rubs. 2+ symmetrical distal pulses are present . No JVD. No LE edema Respiratory: Respiratory effort normal .Lungs sounds clear bilaterally. No wheezes, crackles, or rhonchi.  Gastrointestinal: Soft, non tender, and non distended with positive bowel sounds. No rebound or guarding. Genitourinary: No CVA tenderness. Musculoskeletal: Nontender with normal range of motion in all extremities. No edema, cyanosis, or erythema of extremities. Neurologic: Slightly delayed speech when answering questions and reading.  Deficits appear intermittent.  Language normal.. Face is symmetric. Moving all extremities. No gross focal neurologic deficits . Skin: Skin is warm, dry.  No rash or ulcers Psychiatric: Mood and affect are normal.  Speech somewhat slow, hesitant.  Looks towards husband to assist when answering questions.   Labs on Admission: I have personally reviewed following labs and imaging  studies  CBC: Recent Labs  Lab 03/30/20 2103  WBC 9.4  HGB 13.0  HCT 37.0  MCV 90.0  PLT 249   Basic Metabolic Panel: Recent Labs  Lab 03/30/20 2103  NA 140  K 3.0*  CL 105  CO2 27  GLUCOSE 91  BUN 17  CREATININE 0.76  CALCIUM 9.2   GFR: Estimated Creatinine Clearance: 78.7 mL/min (by C-G formula based on SCr of 0.76 mg/dL). Liver Function Tests: Recent Labs  Lab 03/30/20 2103  AST 24  ALT 23  ALKPHOS 51  BILITOT 0.6  PROT 7.1  ALBUMIN 4.4   No results for input(s): LIPASE, AMYLASE in the last 168 hours. No results for input(s): AMMONIA in the last 168 hours. Coagulation Profile: No results for input(s): INR, PROTIME in the last 168 hours. Cardiac Enzymes: Recent Labs  Lab 03/30/20 2103  CKTOTAL 199   BNP (last 3 results) No results for input(s): PROBNP in the last 8760 hours. HbA1C: No results for input(s): HGBA1C in the last 72 hours. CBG: No results for input(s): GLUCAP in the last 168 hours. Lipid Profile: No results for input(s): CHOL, HDL, LDLCALC, TRIG, CHOLHDL, LDLDIRECT in the last 72 hours. Thyroid Function Tests: No results for input(s): TSH, T4TOTAL, FREET4, T3FREE, THYROIDAB in the last 72 hours. Anemia Panel: No results for input(s): VITAMINB12, FOLATE, FERRITIN, TIBC, IRON, RETICCTPCT in the last 72 hours. Urine analysis:    Component Value Date/Time   COLORURINE STRAW (A) 03/30/2020 2103   APPEARANCEUR CLEAR (A) 03/30/2020 2103   LABSPEC 1.003 (L) 03/30/2020 2103   PHURINE 6.0 03/30/2020 2103   GLUCOSEU NEGATIVE 03/30/2020 2103   HGBUR SMALL (A) 03/30/2020 2103   BILIRUBINUR NEGATIVE 03/30/2020 2103   KETONESUR NEGATIVE 03/30/2020 2103   PROTEINUR NEGATIVE 03/30/2020 2103   NITRITE NEGATIVE 03/30/2020 2103   LEUKOCYTESUR SMALL (A) 03/30/2020 2103    Radiological Exams on Admission: CT Head Wo Contrast  Result Date: 03/30/2020 CLINICAL DATA:  Acute headache with normal neuro exam. EXAM: CT HEAD WITHOUT CONTRAST TECHNIQUE:  Contiguous axial images were obtained from the base of the skull through the vertex without intravenous contrast. COMPARISON:  None. FINDINGS: Brain: Low lying cerebellar tonsils with crowding of the foramen magnum. Inferior extent not entirely included in the field of view. No intracranial hemorrhage, mass effect, or midline shift. No hydrocephalus. No evidence of territorial infarct or acute ischemia. No extra-axial or intracranial fluid collection. Vascular: No hyperdense vessel or unexpected calcification. Skull: No fracture or focal lesion. Sinuses/Orbits: Paranasal sinuses and mastoid air cells are clear. The visualized orbits are unremarkable. Other: None. IMPRESSION: 1. No acute intracranial abnormality. 2. Low lying cerebellar tonsils with crowding of the foramen magnum, may be normal variant  or can be seen with Chiari I malformation. Recommend nonemergent MRI for further evaluation. Electronically Signed   By: Narda Rutherford M.D.   On: 03/30/2020 21:37   MR ANGIO HEAD WO CONTRAST  Result Date: 03/31/2020 CLINICAL DATA:  Headaches and word-finding difficulty. EXAM: MRI HEAD WITHOUT CONTRAST MRA HEAD WITHOUT CONTRAST TECHNIQUE: Multiplanar, multiecho pulse sequences of the brain and surrounding structures were obtained without intravenous contrast. Angiographic images of the head were obtained using MRA technique without contrast. COMPARISON:  None. FINDINGS: MRI HEAD FINDINGS BRAIN: There is no acute infarct, acute hemorrhage or extra-axial collection. The white matter signal is normal for the patient's age. The cerebral and cerebellar volume are age-appropriate. There is no hydrocephalus. Blood-sensitive sequences show no chronic microhemorrhage or superficial siderosis. Cerebellar tonsils extend 14 mm below the foramen magnum. SKULL AND UPPER CERVICAL SPINE: There is a syrinx of the upper cervical spine measuring 4 mm in transverse dimension. SINUSES/ORBITS: No fluid levels or advanced mucosal  thickening. No mastoid or middle ear effusion. The orbits are normal. MRA HEAD FINDINGS POSTERIOR CIRCULATION: --Basilar artery: Normal. --Posterior cerebral arteries: Normal. Both originate from the basilar artery. --Superior cerebellar arteries: Normal. --Inferior cerebellar arteries: Normal anterior and posterior inferior cerebellar arteries. ANTERIOR CIRCULATION: --Intracranial internal carotid arteries: There is a laterally projecting aneurysm of the left ICA ophthalmic segment measuring 3 mm at its base and 5 mm in widest dimension. --Anterior cerebral arteries: Normal. Both A1 segments are present. Patent anterior communicating artery. --Middle cerebral arteries: Normal. --Posterior communicating arteries: Present on the left, absent on the right. 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 2. 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 3. No acute abnormality. Electronically Signed   By: Deatra Robinson M.D.   On: 03/31/2020 03:16   MR BRAIN WO CONTRAST  Result Date: 03/31/2020 CLINICAL DATA:  Headaches and word-finding difficulty. EXAM: MRI HEAD WITHOUT CONTRAST MRA HEAD WITHOUT CONTRAST TECHNIQUE: Multiplanar, multiecho pulse sequences of the brain and surrounding structures were obtained without intravenous contrast. Angiographic images of the head were obtained using MRA technique without contrast. COMPARISON:  None. FINDINGS: MRI HEAD FINDINGS BRAIN: There is no acute infarct, acute hemorrhage or extra-axial collection. The white matter signal is normal for the patient's age. The cerebral and cerebellar volume are age-appropriate. There is no hydrocephalus. Blood-sensitive sequences show no chronic microhemorrhage or superficial siderosis. Cerebellar tonsils extend 14 mm below the foramen magnum. SKULL AND UPPER CERVICAL SPINE: There is a syrinx of the upper cervical spine measuring 4 mm in transverse dimension. SINUSES/ORBITS: No fluid levels or advanced mucosal thickening.  No mastoid or middle ear effusion. The orbits are normal. MRA HEAD FINDINGS POSTERIOR CIRCULATION: --Basilar artery: Normal. --Posterior cerebral arteries: Normal. Both originate from the basilar artery. --Superior cerebellar arteries: Normal. --Inferior cerebellar arteries: Normal anterior and posterior inferior cerebellar arteries. ANTERIOR CIRCULATION: --Intracranial internal carotid arteries: There is a laterally projecting aneurysm of the left ICA ophthalmic segment measuring 3 mm at its base and 5 mm in widest dimension. --Anterior cerebral arteries: Normal. Both A1 segments are present. Patent anterior communicating artery. --Middle cerebral arteries: Normal. --Posterior communicating arteries: Present on the left, absent on the right. 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 2. 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 3. No acute abnormality. Electronically Signed   By: Deatra Robinson M.D.   On: 03/31/2020 03:16    EKG: Independently reviewed.   Assessment/Plan Principal Problem:   Acute headache   Acute confusion/acute  focal neurologic deficit related to speech -Patient presents with a several hour onset of headache and confusion and difficulty word finding.  NIHSS of 1. -Head CT with no acute intracranial abnormality. -MRI and MRA showing small brain aneurysm and Chiari I malformation -Neurologic checks and close monitoring -Neurology consult - Vomiting -Husband reports 3 episodes of vomiting with change in position while waiting in the ER waiting room -No stigmata of infection.  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  May need outpatient neurosurgery    DVT prophylaxis: SCDs, pending neurology eval Code Status: full code  Family  Communication: Extensive discussion with husband regarding the possible cause of patient's symptoms.  He strongly believes symptoms may have to do with patient being out in the yard and drinking water that was too cold, but we explained the importance of having his wife stay for observation for neurology evaluation.  He voiced understanding. disposition Plan: Back to previous home environment Consults called: Neurology Status:obs    Athena Masse MD [facility]     03/31/2020, 4:23 AM

## 2020-03-31 NOTE — ED Notes (Signed)
Pt states her headache continues. Pt is not having any trouble finding words at  This time.

## 2020-03-31 NOTE — ED Notes (Signed)
Nurse called for report, receiving nurse did not have any questions or concerns. Transport called to transfer patient to unit.

## 2020-03-31 NOTE — ED Notes (Signed)
Waiting on pt to come inside from car.

## 2020-03-31 NOTE — ED Notes (Signed)
Pt in MRI. See paper chart for mar and assessement, vitals.

## 2020-03-31 NOTE — ED Notes (Signed)
covid swab obtained and sent to lab

## 2020-03-31 NOTE — Consult Note (Signed)
Reason for Consult:[place] Requesting Physician: Erlinda Hong  CC: AMS  I have been asked by Dr. Erlinda Hong to see this patient in consultation for aphasia.  HPI: Colleen Sanchez is an 44 y.o. female with no significant PMH who who was brought to the ED after her husband noticed that she was confused while sitting looking at TV.  Patient had previously been working in the yard.  Came in called her mother and felt "off" at that time.  She went to take a shower and sat down.  Husband reports that is when he noticed she was confused.  She appeared to not understand what was going on.  He said he asked so the president was and she was unable to answer.  He noticed that she had difficulty answering other questions and decided to bring him to the emergency room.  Patient reports that she knows what she wants to say but is unable to get the words out.  Complained of headache and had some vomiting as well.    Past Medical History:  Diagnosis Date   Known health problems: none     History reviewed. No pertinent surgical history.  No family history on file.  Social History:  reports that she has never smoked. She has never used smokeless tobacco. She reports previous alcohol use. She reports previous drug use.  No Known Allergies  Medications: I have reviewed the patient's current medications. Prior to Admission medications   None     ROS: History obtained from the patient  General ROS: negative for - chills, fatigue, fever, night sweats, weight gain or weight loss Psychological ROS: negative for - behavioral disorder, hallucinations, memory difficulties, mood swings or suicidal ideation Ophthalmic ROS: negative for - blurry vision, double vision, eye pain or loss of vision ENT ROS: negative for - epistaxis, nasal discharge, oral lesions, sore throat, tinnitus or vertigo Allergy and Immunology ROS: negative for - hives or itchy/watery eyes Hematological and Lymphatic ROS: negative for - bleeding problems,  bruising or swollen lymph nodes Endocrine ROS: negative for - galactorrhea, hair pattern changes, polydipsia/polyuria or temperature intolerance Respiratory ROS: negative for - cough, hemoptysis, shortness of breath or wheezing Cardiovascular ROS: negative for - chest pain, dyspnea on exertion, edema or irregular heartbeat Gastrointestinal ROS: as noted in HPI Genito-Urinary ROS: negative for - dysuria, hematuria, incontinence or urinary frequency/urgency Musculoskeletal ROS: negative for - joint swelling or muscular weakness Neurological ROS: as noted in HPI Dermatological ROS: negative for rash and skin lesion changes  Physical Examination: Blood pressure 110/67, pulse 87, temperature 98.3 F (36.8 C), resp. rate 18, height 5\' 4"  (1.626 m), weight 54.4 kg, SpO2 98 %.  HEENT-  Normocephalic, no lesions, without obvious abnormality.  Normal external eye and conjunctiva.  Normal TM's bilaterally.  Normal auditory canals and external ears. Normal external nose, mucus membranes and septum.  Normal pharynx. Cardiovascular- S1, S2 normal, pulses palpable throughout   Lungs- chest clear, no wheezing, rales, normal symmetric air entry Abdomen- soft, non-tender; bowel sounds normal; no masses,  no organomegaly Extremities- no edema Lymph-no adenopathy palpable Musculoskeletal-no joint tenderness, deformity or swelling Skin-warm and dry, no hyperpigmentation, vitiligo, or suspicious lesions  Neurological Examination   Mental Status: Alert, oriented, thought content appropriate.  Speech fluent without evidence of aphasia.  Able to follow 3 step commands without difficulty. Cranial Nerves: II: Discs flat bilaterally; Visual fields grossly normal, pupils equal, round, reactive to light and accommodation III,IV, VI: ptosis not present, extra-ocular motions intact bilaterally V,VII: smile  symmetric, facial light touch sensation normal bilaterally VIII: hearing normal bilaterally IX,X: gag reflex  present XI: bilateral shoulder shrug XII: midline tongue extension Motor: Right : Upper extremity   5/5    Left:     Upper extremity   5/5  Lower extremity   5/5     Lower extremity   5/5 Tone and bulk:normal tone throughout; no atrophy noted Sensory: Pinprick and light touch intact throughout, bilaterally Deep Tendon Reflexes: 2+ and symmetric throughout Plantars: Right: downgoing   Left: downgoing Cerebellar: normal finger-to-nose, normal rapid alternating movements and normal heel-to-shin test Gait: not tested due to safety concerns    Laboratory Studies:   Basic Metabolic Panel: Recent Labs  Lab 03/30/20 2103  NA 140  K 3.0*  CL 105  CO2 27  GLUCOSE 91  BUN 17  CREATININE 0.76  CALCIUM 9.2    Liver Function Tests: Recent Labs  Lab 03/30/20 2103  AST 24  ALT 23  ALKPHOS 51  BILITOT 0.6  PROT 7.1  ALBUMIN 4.4   No results for input(s): LIPASE, AMYLASE in the last 168 hours. No results for input(s): AMMONIA in the last 168 hours.  CBC: Recent Labs  Lab 03/30/20 2103  WBC 9.4  HGB 13.0  HCT 37.0  MCV 90.0  PLT 249    Cardiac Enzymes: Recent Labs  Lab 03/30/20 2103  CKTOTAL 199    BNP: Invalid input(s): POCBNP  CBG: No results for input(s): GLUCAP in the last 168 hours.  Microbiology: Results for orders placed or performed during the hospital encounter of 03/31/20  SARS Coronavirus 2 by RT PCR (hospital order, performed in [facility] hospital lab) Nasopharyngeal Nasopharyngeal Swab     Status: None   Collection Time: 03/31/20  9:55 AM   Specimen: Nasopharyngeal Swab  Result Value Ref Range Status   SARS Coronavirus 2 NEGATIVE NEGATIVE Final    Comment: (NOTE) SARS-CoV-2 target nucleic acids are NOT DETECTED.  The SARS-CoV-2 RNA is generally detectable in upper and lower respiratory specimens during the acute phase of infection. The lowest concentration of SARS-CoV-2 viral copies this assay can detect is 250 copies / mL. A negative  result does not preclude SARS-CoV-2 infection and should not be used as the sole basis for treatment or other patient management decisions.  A negative result may occur with improper specimen collection / handling, submission of specimen other than nasopharyngeal swab, presence of viral mutation(s) within the areas targeted by this assay, and inadequate number of viral copies (<250 copies / mL). A negative result must be combined with clinical observations, patient history, and epidemiological information.  Fact Sheet for Patients:   BoilerBrush.com.cy  Fact Sheet for Healthcare Providers: https://pope.com/  This test is not yet approved or  cleared by the [place] FDA and has been authorized for detection and/or diagnosis of SARS-CoV-2 by FDA under an Emergency Use Authorization (EUA).  This EUA will remain in effect (meaning this test can be used) for the duration of the COVID-19 declaration under Section 564(b)(1) of the Act, 21 U.S.C. section 360bbb-3(b)(1), unless the authorization is terminated or revoked sooner.  Performed at [facility], [address] [place], [place] [address]     Coagulation Studies: No results for input(s): LABPROT, INR in the last 72 hours.  Urinalysis:  Recent Labs  Lab 03/30/20 2103  COLORURINE STRAW*  LABSPEC 1.003*  PHURINE 6.0  GLUCOSEU NEGATIVE  HGBUR SMALL*  BILIRUBINUR NEGATIVE  KETONESUR NEGATIVE  PROTEINUR NEGATIVE  NITRITE NEGATIVE  LEUKOCYTESUR SMALL*    Lipid Panel:  No results found for: CHOL, TRIG, HDL, CHOLHDL, VLDL, LDLCALC  HgbA1C: No results found for: HGBA1C  Urine Drug Screen:  No results found for: LABOPIA, COCAINSCRNUR, LABBENZ, AMPHETMU, THCU, LABBARB  Alcohol Level: No results for input(s): ETH in the last 168 hours.   Imaging: CT Head Wo Contrast  Result Date: 03/30/2020 CLINICAL DATA:  Acute headache with normal neuro exam. EXAM: CT HEAD  WITHOUT CONTRAST TECHNIQUE: Contiguous axial images were obtained from the base of the skull through the vertex without intravenous contrast. COMPARISON:  None. FINDINGS: Brain: Low lying cerebellar tonsils with crowding of the foramen magnum. Inferior extent not entirely included in the field of view. No intracranial hemorrhage, mass effect, or midline shift. No hydrocephalus. No evidence of territorial infarct or acute ischemia. No extra-axial or intracranial fluid collection. Vascular: No hyperdense vessel or unexpected calcification. Skull: No fracture or focal lesion. Sinuses/Orbits: Paranasal sinuses and mastoid air cells are clear. The visualized orbits are unremarkable. Other: None. IMPRESSION: 1. No acute intracranial abnormality. 2. Low lying cerebellar tonsils with crowding of the foramen magnum, may be normal variant or can be seen with Chiari I malformation. Recommend nonemergent MRI for further evaluation. Electronically Signed   By: Narda Rutherford M.D.   On: 03/30/2020 21:37   MR ANGIO HEAD WO CONTRAST  Result Date: 03/31/2020 CLINICAL DATA:  Headaches and word-finding difficulty. EXAM: MRI HEAD WITHOUT CONTRAST MRA HEAD WITHOUT CONTRAST TECHNIQUE: Multiplanar, multiecho pulse sequences of the brain and surrounding structures were obtained without intravenous contrast. Angiographic images of the head were obtained using MRA technique without contrast. COMPARISON:  None. FINDINGS: MRI HEAD FINDINGS BRAIN: There is no acute infarct, acute hemorrhage or extra-axial collection. The white matter signal is normal for the patient's age. The cerebral and cerebellar volume are age-appropriate. There is no hydrocephalus. Blood-sensitive sequences show no chronic microhemorrhage or superficial siderosis. Cerebellar tonsils extend 14 mm below the foramen magnum. SKULL AND UPPER CERVICAL SPINE: There is a syrinx of the upper cervical spine measuring 4 mm in transverse dimension. SINUSES/ORBITS: No fluid  levels or advanced mucosal thickening. No mastoid or middle ear effusion. The orbits are normal. MRA HEAD FINDINGS POSTERIOR CIRCULATION: --Basilar artery: Normal. --Posterior cerebral arteries: Normal. Both originate from the basilar artery. --Superior cerebellar arteries: Normal. --Inferior cerebellar arteries: Normal anterior and posterior inferior cerebellar arteries. ANTERIOR CIRCULATION: --Intracranial internal carotid arteries: There is a laterally projecting aneurysm of the left ICA ophthalmic segment measuring 3 mm at its base and 5 mm in widest dimension. --Anterior cerebral arteries: Normal. Both A1 segments are present. Patent anterior communicating artery. --Middle cerebral arteries: Normal. --Posterior communicating arteries: Present on the left, absent on the right. 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 2. 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 3. No acute abnormality. Electronically Signed   By: Deatra Robinson M.D.   On: 03/31/2020 03:16   MR BRAIN WO CONTRAST  Result Date: 03/31/2020 CLINICAL DATA:  Headaches and word-finding difficulty. EXAM: MRI HEAD WITHOUT CONTRAST MRA HEAD WITHOUT CONTRAST TECHNIQUE: Multiplanar, multiecho pulse sequences of the brain and surrounding structures were obtained without intravenous contrast. Angiographic images of the head were obtained using MRA technique without contrast. COMPARISON:  None. FINDINGS: MRI HEAD FINDINGS BRAIN: There is no acute infarct, acute hemorrhage or extra-axial collection. The white matter signal is normal for the patient's age. The cerebral and cerebellar volume are age-appropriate. There is no hydrocephalus. Blood-sensitive sequences show no chronic microhemorrhage  or superficial siderosis. Cerebellar tonsils extend 14 mm below the foramen magnum. SKULL AND UPPER CERVICAL SPINE: There is a syrinx of the upper cervical spine measuring 4 mm in transverse dimension. SINUSES/ORBITS: No fluid levels or  advanced mucosal thickening. No mastoid or middle ear effusion. The orbits are normal. MRA HEAD FINDINGS POSTERIOR CIRCULATION: --Basilar artery: Normal. --Posterior cerebral arteries: Normal. Both originate from the basilar artery. --Superior cerebellar arteries: Normal. --Inferior cerebellar arteries: Normal anterior and posterior inferior cerebellar arteries. ANTERIOR CIRCULATION: --Intracranial internal carotid arteries: There is a laterally projecting aneurysm of the left ICA ophthalmic segment measuring 3 mm at its base and 5 mm in widest dimension. --Anterior cerebral arteries: Normal. Both A1 segments are present. Patent anterior communicating artery. --Middle cerebral arteries: Normal. --Posterior communicating arteries: Present on the left, absent on the right. 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 2. 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 3. No acute abnormality. Electronically Signed   By: Deatra Robinson M.D.   On: 03/31/2020 03:16     Assessment/Plan: 44 year old female with no significant PMH presenting with altered mental status and difficulty with speech.  Patient improved today and although she reports that she still has some word finding difficulties at times, she is completely fluent during my evaluation.  MRI of the brain personally reviewed and shows a Chiari I with a small (35mm) cervical syrinx and a 3X16mm left ophthalmic artery aneurysm.  I do not feel either of these findings are related to the patient's symptoms but they will require follow up.  Presentation may be secondary to heat exposure.  Will rule out other possibilities and continue to observe.    Recommendations: 1. EEG 2. Neurosurgery evaluation.  May be performed on an outpatient basis 3. Telemetry 4. Frequent neuro checks.  Thana Farr, MD Neurology [phone] 03/31/2020, 12:49 PM

## 2020-03-31 NOTE — ED Notes (Signed)
Dr. Para March in to see pt. NIH: pt is not able to recognize some words with NIH reading sentences, but is able to correctly identify pictures and describe cookie jar and sink overlfowing scene.

## 2020-03-31 NOTE — ED Provider Notes (Addendum)
[facility] Emergency Department Provider Note ________   First MD Initiated Contact with Patient 03/31/20 0050     (approximate)  I have reviewed the triage vital signs and the nursing notes.   HISTORY  Chief Complaint Headache    HPI Colleen Sanchez is a 44 y.o. female presents to the emergency department secondary to acute onset of generalized headache which began at 7:00 PM tonight.  Patient's husband states that he and his wife are working out doors gardening today and that it was quite humid.  He states that when his wife came inside she was markedly confused including being unable to state who the president was at this time.  He also states that his wife had difficulty stating what she wanted to.  On arrival to the emergency department the patient states "I cannot make sense out of anything tonight".  During evaluation patient stated multiple times that she was unable to state what she wanted to.  Patient afebrile on presentation temperature 98.2.  Both her and her husband attest that she kept herself hydrated while outside today.        Past Medical History:  Diagnosis Date  . Known health problems: none     Patient Active Problem List   Diagnosis Date Noted  . Acute headache 03/31/2020  . 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 Acute confusion 03/31/2020  . Acute focal neurological deficit 03/31/2020  . 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 Hypokalemia 03/31/2020      Prior to Admission medications   Not on File    Allergies Patient has no known allergies.  No family history on file.  Social History Social History   Tobacco Use  . Smoking status: Never Smoker  . Smokeless tobacco: Never Used  Substance Use Topics  . Alcohol use: Not Currently  . Drug use: Not Currently    Review of Systems Constitutional: No fever/chills Eyes: No visual changes. ENT: No sore  throat. Cardiovascular: Denies chest pain. Respiratory: Denies shortness of breath. Gastrointestinal: No abdominal pain.  No nausea, no vomiting.  No diarrhea.  No constipation. Genitourinary: Negative for dysuria. Musculoskeletal: Negative for neck pain.  Negative for back pain. Integumentary: Negative for rash. Neurological: Is it for headache, word finding difficulty, confusion.   ____________________________________________   PHYSICAL EXAM:  VITAL SIGNS: ED Triage Vitals  Enc Vitals Group     BP 03/30/20 2051 136/75     Pulse Rate 03/30/20 2051 91     Resp 03/30/20 2051 18     Temp 03/30/20 2051 98.2 F (36.8 C)     Temp Source 03/30/20 2051 Oral     SpO2 03/30/20 2051 99 %     Weight 03/30/20 2053 54.4 kg (120 lb)     Height 03/30/20 2053 1.626 m (5\' 4" )     Head Circumference --      Peak Flow --      Pain Score 03/30/20 2052 1     Pain Loc --      Pain Edu? --      Excl. in GC? --     Constitutional: Alert and oriented.  Eyes: Conjunctivae are normal.  Head: Atraumatic. Mouth/Throat: Patient is wearing a mask. Neck: No stridor.  No meningeal signs.   Cardiovascular: Normal rate, regular rhythm. Good peripheral circulation. Grossly normal heart sounds. Respiratory: Normal respiratory effort.  No retractions. Gastrointestinal: Soft and nontender. No distention.  Musculoskeletal:  No lower extremity tenderness nor edema. No gross deformities of extremities. Neurologic:  Normal speech and language. No gross focal neurologic deficits are appreciated.  Patient with word finding difficulty Skin:  Skin is warm, dry and intact. Psychiatric: Mood and affect are normal. Speech and behavior are normal.  ____________________________________________   LABS (all labs ordered are listed, but only abnormal results are displayed)  Labs Reviewed  BASIC METABOLIC PANEL - Abnormal; Notable for the following components:      Result Value   Potassium 3.0 (*)    All other  components within normal limits  URINALYSIS, COMPLETE (UACMP) WITH MICROSCOPIC - Abnormal; Notable for the following components:   Color, Urine STRAW (*)    APPearance CLEAR (*)    Specific Gravity, Urine 1.003 (*)    Hgb urine dipstick SMALL (*)    Leukocytes,Ua SMALL (*)    Bacteria, UA RARE (*)    All other components within normal limits  CBC  CK  HEPATIC FUNCTION PANEL  HIV ANTIBODY (ROUTINE TESTING W REFLEX)  HEMOGLOBIN A1C  LIPID PANEL  POC URINE PREG, ED  POCT PREGNANCY, URINE   ____________________________________________ ________________________  RADIOLOGY I, Darci Current, personally viewed and evaluated these images (plain radiographs) as part of my medical decision making, as well as reviewing the written report by the radiologist.  ED MD interpretation: CT head revealed no acute intracranial abnormality low-lying cerebellar tonsils with crowding the foramen magnum may represent a normal variant versus Chiari I malformation per radiologist on CT head impression.  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 EXAM: CT HEAD WITHOUT CONTRAST TECHNIQUE: Contiguous axial images were obtained from the base of the skull through the vertex without intravenous contrast. COMPARISON:  None. FINDINGS: Brain: Low lying cerebellar tonsils with crowding of the foramen magnum. Inferior extent not entirely included in the field of view. No intracranial hemorrhage, mass effect, or midline shift. No hydrocephalus. No evidence of territorial infarct or acute ischemia. No extra-axial or intracranial fluid collection. Vascular: No hyperdense vessel or unexpected calcification. Skull: No fracture or focal lesion. Sinuses/Orbits: Paranasal sinuses and mastoid air cells are clear. The  visualized orbits are unremarkable. Other: None. IMPRESSION: 1. No acute intracranial abnormality. 2. Low lying cerebellar tonsils with crowding of the foramen magnum, may be normal variant or can be seen with Chiari I malformation. Recommend nonemergent MRI for further evaluation. Electronically Signed   By: Narda Rutherford M.D.   On: 03/30/2020 21:37   MR ANGIO HEAD WO CONTRAST  Result Date: 03/31/2020 CLINICAL DATA:  Headaches and word-finding difficulty. EXAM: MRI HEAD WITHOUT CONTRAST MRA HEAD WITHOUT CONTRAST TECHNIQUE: Multiplanar, multiecho pulse sequences of the brain and surrounding structures were obtained without intravenous contrast. Angiographic images of the head were obtained using MRA technique without contrast. COMPARISON:  None. FINDINGS: MRI HEAD FINDINGS BRAIN: There is no acute infarct, acute hemorrhage or extra-axial collection. The white matter signal is normal for the patient's age. The cerebral and cerebellar volume are age-appropriate. There is no hydrocephalus. Blood-sensitive sequences show no chronic microhemorrhage or superficial siderosis. Cerebellar tonsils extend 14 mm below the foramen magnum. SKULL AND UPPER CERVICAL SPINE: There is a syrinx of the upper cervical spine measuring 4 mm in transverse dimension. SINUSES/ORBITS: No fluid levels or advanced mucosal thickening. No  mastoid or middle ear effusion. The orbits are normal. MRA HEAD FINDINGS POSTERIOR CIRCULATION: --Basilar artery: Normal. --Posterior cerebral arteries: Normal. Both originate from the basilar artery. --Superior cerebellar arteries: Normal. --Inferior cerebellar arteries: Normal anterior and posterior inferior cerebellar arteries. ANTERIOR CIRCULATION: --Intracranial internal carotid arteries: There is a laterally projecting aneurysm of the left ICA ophthalmic segment measuring 3 mm at its base and 5 mm in widest dimension. --Anterior cerebral arteries: Normal. Both A1 segments are present. Patent anterior  communicating artery. --Middle cerebral arteries: Normal. --Posterior communicating arteries: Present on the left, absent on the right. 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 2. 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 3. No acute abnormality. Electronically Signed   By: Ulyses Jarred M.D.   On: 03/31/2020 03:16   MR BRAIN WO CONTRAST  Result Date: 03/31/2020 CLINICAL DATA:  Headaches and word-finding difficulty. EXAM: MRI HEAD WITHOUT CONTRAST MRA HEAD WITHOUT CONTRAST TECHNIQUE: Multiplanar, multiecho pulse sequences of the brain and surrounding structures were obtained without intravenous contrast. Angiographic images of the head were obtained using MRA technique without contrast. COMPARISON:  None. FINDINGS: MRI HEAD FINDINGS BRAIN: There is no acute infarct, acute hemorrhage or extra-axial collection. The white matter signal is normal for the patient's age. The cerebral and cerebellar volume are age-appropriate. There is no hydrocephalus. Blood-sensitive sequences show no chronic microhemorrhage or superficial siderosis. Cerebellar tonsils extend 14 mm below the foramen magnum. SKULL AND UPPER CERVICAL SPINE: There is a syrinx of the upper cervical spine measuring 4 mm in transverse dimension. SINUSES/ORBITS: No fluid levels or advanced mucosal thickening. No mastoid or middle ear effusion. The orbits are normal. MRA HEAD FINDINGS POSTERIOR CIRCULATION: --Basilar artery: Normal. --Posterior cerebral arteries: Normal. Both originate from the basilar artery. --Superior cerebellar arteries: Normal. --Inferior cerebellar arteries: Normal anterior and posterior inferior cerebellar arteries. ANTERIOR CIRCULATION: --Intracranial internal carotid arteries: There is a laterally projecting aneurysm of the left ICA ophthalmic segment measuring 3 mm at its base and 5 mm in widest dimension. --Anterior cerebral arteries: Normal. Both A1 segments are present. Patent anterior  communicating artery. --Middle cerebral arteries: Normal. --Posterior communicating arteries: Present on the left, absent on the right. 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 2. 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 3. No acute abnormality. Electronically Signed   By: Ulyses Jarred M.D.   On: 03/31/2020 03:16     Procedures   ____________________________________________   INITIAL IMPRESSION / MDM / Conrad / ED COURSE  As part of my medical decision making, I reviewed the following data within the electronic MEDICAL RECORD NUMBER  44 year old female presented with above-stated history and physical exam differential diagnosis including but not limited to CVA, heatstroke, encephalopathy.  Laboratory data unremarkable including liver enzymes.  CT head revealed no acute intracranial abnormality concern for possible Chiari I malformation.  MRI of the brain was performed which confirmed Chiari I malformation as well as a 3 x 5 mm right ICA ophthalmic branch aneurysm.  Patient continues to have headache despite receiving 2 doses of IV morphine and subsequently IV Toradol.  In addition patient continues to have confusion and word finding difficulty.  As such patient discussed with hospitalist after hospital admission for further evaluation including neurology evaluation. ____________________________________________  FINAL CLINICAL IMPRESSION(S) / ED DIAGNOSES  Final diagnoses:  Chiari I malformation ([place])  Acute confusion     MEDICATIONS GIVEN DURING THIS VISIT:  Medications   stroke: mapping our early stages  of recovery book (has no administration in time range)  0.9 %  sodium chloride infusion ( Intravenous New Bag/Given 03/31/20 0549)  acetaminophen (TYLENOL) tablet 650 mg (has no administration in time range)    Or  acetaminophen (TYLENOL) 160 MG/5ML solution 650 mg (has no administration in time range)    Or  acetaminophen (TYLENOL)  suppository 650 mg (has no administration in time range)  sodium chloride 0.9 % bolus 1,000 mL (0 mLs Intravenous Stopped 03/31/20 0244)  sodium chloride 0.9 % bolus 1,000 mL (0 mLs Intravenous Stopped 03/31/20 0418)  ondansetron (ZOFRAN) 4 MG/2ML injection (  Given 03/31/20 0114)  morphine 2 MG/ML injection (2 mg Intravenous Given 03/31/20 0110)  morphine 4 MG/ML injection 4 mg (4 mg Intravenous Given 03/31/20 0253)  ketorolac (TORADOL) 30 MG/ML injection 30 mg (30 mg Intravenous Given 03/31/20 0419)  potassium chloride SA (KLOR-CON) CR tablet 40 mEq (40 mEq Oral Given 03/31/20 0550)     ED Discharge Orders    None      *Please note:  Colleen Sanchez was evaluated in Emergency Department on 03/31/2020 for the symptoms described in the history of present illness. She was evaluated in the context of the global COVID-19 pandemic, which necessitated consideration that the patient might be at risk for infection with the SARS-CoV-2 virus that causes COVID-19. Institutional protocols and algorithms that pertain to the evaluation of patients at risk for COVID-19 are in a state of rapid change based on information released by regulatory bodies including the CDC and federal and state organizations. These policies and algorithms were followed during the patient's care in the ED.  Some ED evaluations and interventions may be delayed as a result of limited staffing during the pandemic.*  Note:  This document was prepared using Dragon voice recognition software and may include unintentional dictation errors.   Darci Current, MD 03/31/20 9528    Darci Current, MD 03/31/20 [phone]

## 2020-03-31 NOTE — ED Notes (Signed)
Patient ambulates to bathroom without difficulties. S/O at bedside.

## 2020-03-31 NOTE — Progress Notes (Signed)
*  PRELIMINARY RESULTS* Echocardiogram 2D Echocardiogram has been performed.  Colleen Sanchez 03/31/2020, 9:08 AM

## 2020-03-31 NOTE — ED Notes (Signed)
Patient was told that she is awaiting different bed. 1st bed was taken away and she was down graded to a lower level of care. Patient verbalized understanding of delay in bed status.

## 2020-03-31 NOTE — ED Notes (Signed)
Report to jeanette, rn.  

## 2020-03-31 NOTE — ED Notes (Signed)
Assigned bed @ 1230. Spoke with Media planner.

## 2020-03-31 NOTE — Progress Notes (Signed)
Admitted pt from ED holding area to room 101. Pt stated came in last with questionable stroke symptoms. Pt was able to ambulate from hospital cart from the door to room without any difficult's.  Pt denies any pain but stated head still feel fuzzy but stated " it feels much better than last night and this morning. Pt husband at bed side. Pt oriented to room and call light.

## 2020-03-31 NOTE — ED Notes (Signed)
Patient advised she is awaiting transfer to unit. Patient pleasant smiling and verbalized understanding. S/o t go up with patient.

## 2020-03-31 NOTE — ED Notes (Signed)
Awaiting eeg. Lunch provided. Pt to consult patient.

## 2020-03-31 NOTE — ED Notes (Signed)
COVID SWAB PERFORMED AND TAKEN TO LAB.

## 2020-03-31 NOTE — ED Notes (Signed)
Attempt to call report as per secretary Amy. Charge nurse has not assigned patient to nurse, is contacting admit Md to question if this patient meets criteria for progressive care. As per secretary charge nurse will call ED and let someone know if patient will be coming to that unit.

## 2020-03-31 NOTE — Evaluation (Signed)
Physical Therapy Evaluation Patient Details Name: Colleen Sanchez MRN: [id number] DOB: 01-06-1976 Today's Date: 03/31/2020   History of Present Illness  Patient is a 44 y.o. female with no significant PMH who who was brought to the ED after her husband noticed that she was confused after working in the yard. Patient complained of a headache and had some vomiting. MRI report of the brain shows Chiari I malformation and 3X41mm left ophthalmic artery aneurysm with no acute abnormality.   Clinical Impression  PT evaluation completed. Patient was able to perform bed mobility, transfers, and ambulation independently without assistive device. Static and dynamic balance is normal observed during functional tasks. Patient walked 361ft around obstacles without difficulty. Lower extremity strength, coordination, and proprioception seem WNL during assessment (see below for details). Patient appears to be at her baseline level of functional mobility and is independent with activity. PT evaluation and education completed this session. Will discharge from PT with no PT needs identified at this time.     Follow Up Recommendations No PT follow up    Equipment Recommendations  None recommended by PT    Recommendations for Other Services       Precautions / Restrictions Precautions Precautions: Fall Restrictions Weight Bearing Restrictions: No      Mobility  Bed Mobility Overal bed mobility: Independent             General bed mobility comments: supine to and from short sitting   Transfers Overall transfer level: Independent                  Ambulation/Gait Ambulation/Gait assistance: Independent Gait Distance (Feet): 300 Feet Assistive device: None Gait Pattern/deviations: WFL(Within Functional Limits);Step-through pattern     General Gait Details: steady with no loss of balance with ambulation in hallway, navigating obstacles, and with head turns while ambulating. patient does not  report increased headache with activity   Stairs            Wheelchair Mobility    Modified Rankin (Stroke Patients Only)       Balance Overall balance assessment: Independent                                           Pertinent Vitals/Pain Pain Assessment: No/denies pain (reports no pain, although states mild headache )    Home Living Family/patient expects to be discharged to:: Private residence Living Arrangements: Spouse/significant other;Children (2 children and spouse ) Available Help at Discharge: Family;Available PRN/intermittently Type of Home: House Home Access: Stairs to enter   Entrance Stairs-Number of Steps: 3 Home Layout: One level Home Equipment: None      Prior Function Level of Independence: Independent         Comments: works as a Designer, fashion/clothing   Dominant Hand: Right    Extremity/Trunk Assessment   Upper Extremity Assessment Upper Extremity Assessment: Overall WFL for tasks assessed    Lower Extremity Assessment Lower Extremity Assessment: Overall WFL for tasks assessed RLE Deficits / Details: 5/5 hip add/abd, knee extension, dorsiflexion/plantarflexion  RLE Sensation: WNL (light touch and great toe flexion/extension proprioception ) RLE Coordination: WNL (heel to shin without dysmetria ) LLE Deficits / Details: 5/5 hip add/abd, knee extension, dorsiflexion/plantarflexion  LLE Sensation: WNL (light touch and great toe flexion/extension proprioception ) LLE Coordination: WNL (heel to shin without dysmetria )  Communication   Communication: No difficulties  Cognition Arousal/Alertness: Awake/alert Behavior During Therapy: WFL for tasks assessed/performed Overall Cognitive Status: Within Functional Limits for tasks assessed                                 General Comments: slightly groggy initially but alert, oriented, and able to follow multi step commands without  difficulty       General Comments General comments (skin integrity, edema, etc.): patient is able to reach outside base of support to open door, brush teeth at sink, and functional ambulation without assistive device independently     Exercises Other Exercises Other Exercises: OT facilitates education re: role of OT in acute setting. Pt and spouse with good udnerstanding.   Assessment/Plan    PT Assessment Patent does not need any further PT services  PT Problem List         PT Treatment Interventions      PT Goals (Current goals can be found in the Care Plan section)  Acute Rehab PT Goals Patient Stated Goal: to go home PT Goal Formulation: With patient Time For Goal Achievement:  (PT evaluation and education completed today. D/C from PT )    Frequency     Barriers to discharge        Co-evaluation               AM-PAC PT "6 Clicks" Mobility  Outcome Measure Help needed turning from your back to your side while in a flat bed without using bedrails?: None Help needed moving from lying on your back to sitting on the side of a flat bed without using bedrails?: None Help needed moving to and from a bed to a chair (including a wheelchair)?: None Help needed standing up from a chair using your arms (e.g., wheelchair or bedside chair)?: None Help needed to walk in hospital room?: None Help needed climbing 3-5 steps with a railing? : None 6 Click Score: 24    End of Session   Activity Tolerance: Patient tolerated treatment well Patient left: in bed;with call bell/phone within reach Nurse Communication: Mobility status PT Visit Diagnosis: Muscle weakness (generalized) (M62.81)    Time: 2778-2423 PT Time Calculation (min) (ACUTE ONLY): 16 min   Charges:   PT Evaluation $PT Eval Low Complexity: 1 Low          Minna Merritts, PT, MPT   Percell Locus 03/31/2020, 1:55 PM

## 2020-04-01 DIAGNOSIS — E876 Hypokalemia: Secondary | ICD-10-CM

## 2020-04-01 DIAGNOSIS — I671 Cerebral aneurysm, nonruptured: Secondary | ICD-10-CM

## 2020-04-01 DIAGNOSIS — R410 Disorientation, unspecified: Secondary | ICD-10-CM | POA: Diagnosis not present

## 2020-04-01 DIAGNOSIS — G935 Compression of brain: Secondary | ICD-10-CM

## 2020-04-01 LAB — CBC WITH DIFFERENTIAL/PLATELET
Abs Immature Granulocytes: 0.01 10*3/uL (ref 0.00–0.07)
Basophils Absolute: 0 10*3/uL (ref 0.0–0.1)
Basophils Relative: 1 %
Eosinophils Absolute: 0.8 10*3/uL — ABNORMAL HIGH (ref 0.0–0.5)
Eosinophils Relative: 12 %
HCT: 33.4 % — ABNORMAL LOW (ref 36.0–46.0)
Hemoglobin: 11.2 g/dL — ABNORMAL LOW (ref 12.0–15.0)
Immature Granulocytes: 0 %
Lymphocytes Relative: 37 %
Lymphs Abs: 2.4 10*3/uL (ref 0.7–4.0)
MCH: 31.5 pg (ref 26.0–34.0)
MCHC: 33.5 g/dL (ref 30.0–36.0)
MCV: 94.1 fL (ref 80.0–100.0)
Monocytes Absolute: 0.3 10*3/uL (ref 0.1–1.0)
Monocytes Relative: 5 %
Neutro Abs: 2.8 10*3/uL (ref 1.7–7.7)
Neutrophils Relative %: 45 %
Platelets: 197 10*3/uL (ref 150–400)
RBC: 3.55 MIL/uL — ABNORMAL LOW (ref 3.87–5.11)
RDW: 12.5 % (ref 11.5–15.5)
WBC: 6.3 10*3/uL (ref 4.0–10.5)
nRBC: 0 % (ref 0.0–0.2)

## 2020-04-01 LAB — BASIC METABOLIC PANEL
Anion gap: 5 (ref 5–15)
BUN: 12 mg/dL (ref 6–20)
CO2: 25 mmol/L (ref 22–32)
Calcium: 7.9 mg/dL — ABNORMAL LOW (ref 8.9–10.3)
Chloride: 111 mmol/L (ref 98–111)
Creatinine, Ser: 0.58 mg/dL (ref 0.44–1.00)
GFR calc Af Amer: >60 mL/min (ref >60)
GFR calc non Af Amer: >60 mL/min (ref >60)
Glucose, Bld: 93 mg/dL (ref 70–99)
Potassium: 4 mmol/L (ref 3.5–5.1)
Sodium: 141 mmol/L (ref 135–145)

## 2020-04-01 LAB — LIPID PANEL
Cholesterol: 131 mg/dL (ref 0–200)
HDL: 59 mg/dL (ref >40)
LDL Cholesterol: 66 mg/dL (ref 0–99)
Total CHOL/HDL Ratio: 2.2 RATIO
Triglycerides: 31 mg/dL (ref <150)
VLDL: 6 mg/dL (ref 0–40)

## 2020-04-01 LAB — HEMOGLOBIN A1C
Hgb A1c MFr Bld: 5.2 % (ref 4.8–5.6)
Mean Plasma Glucose: 102.54 mg/dL

## 2020-04-01 LAB — MAGNESIUM: Magnesium: 1.7 mg/dL (ref 1.7–2.4)

## 2020-04-01 LAB — SEDIMENTATION RATE: Sed Rate: 7 mm/hr (ref 0–20)

## 2020-04-01 LAB — C-REACTIVE PROTEIN: CRP: 0.6 mg/dL (ref <1.0)

## 2020-04-01 MED ORDER — MAGNESIUM SULFATE 2 GM/50ML IV SOLN
2.0000 g | Freq: Once | INTRAVENOUS | Status: DC
  Filled 2020-04-01: qty 50

## 2020-04-01 NOTE — Progress Notes (Signed)
eeg completed ° °

## 2020-04-01 NOTE — Procedures (Signed)
ELECTROENCEPHALOGRAM REPORT   Patient: Colleen Sanchez       Room #: 101A-AA EEG No. ID: [id number] Age: 44 y.o.        Sex: female Requesting Physician: Roda Shutters Report Date:  04/01/2020        Interpreting Physician: Thana Farr  History: Colleen Sanchez is an 44 y.o. female with episode of confusion  Medications:  None  Conditions of Recording:  This is a 21 channel routine scalp EEG performed with bipolar and monopolar montages arranged in accordance to the international 10/20 system of electrode placement. One channel was dedicated to EKG recording.  The patient is in the awake, drowsy and asleep states.  Description:  The waking background activity consists of a low voltage, symmetrical, fairly well organized, 10 Hz alpha activity, seen from the parieto-occipital and posterior temporal regions.  Low voltage fast activity, poorly organized, is seen anteriorly and is at times superimposed on more posterior regions.  A mixture of theta and alpha rhythms are seen from the central and temporal regions. The patient drowses with slowing to irregular, low voltage theta and beta activity.   The patient goes in to a light sleep with symmetrical sleep spindles, vertex central sharp transients and irregular slow activity.   No epileptiform activity is noted.   Hyperventilation was not performed.  Intermittent photic stimulation was performed but failed to illicit any change in the tracing.    IMPRESSION: Normal electroencephalogram, awake, asleep and with activation procedures. There are no focal lateralizing or epileptiform features.   Thana Farr, MD Neurology [phone] 04/01/2020, 2:40 PM

## 2020-04-01 NOTE — Progress Notes (Signed)
Subjective: Patient feels she is much better today. No speech difficulties.  No new neurological complaints.    Objective: Current vital signs: BP (!) 88/62 (BP Location: Right Arm)   Pulse 68   Temp 97.9 F (36.6 C)   Resp 17   Ht 5\' 4"  (1.626 m)   Wt 54.4 kg   SpO2 97%   BMI 20.60 kg/m  Vital signs in last 24 hours: Temp:  [97.9 F (36.6 C)-98.6 F (37 C)] 97.9 F (36.6 C) (06/11 0742) Pulse Rate:  [64-96] 68 (06/11 0742) Resp:  [16-20] 17 (06/11 0742) BP: (86-110)/(51-70) 88/62 (06/11 0742) SpO2:  [97 %-100 %] 97 % (06/11 0742)  Intake/Output from previous day: No intake/output data recorded. Intake/Output this shift: No intake/output data recorded. Nutritional status:  Diet Order            Diet regular Room service appropriate? Yes; Fluid consistency: Thin  Diet effective now                 Neurologic Exam: Mental Status: Alert, oriented, thought content appropriate.  Speech fluent without evidence of aphasia.  Able to follow 3 step commands without difficulty. Cranial Nerves: II: Discs flat bilaterally; Visual fields grossly normal, pupils equal, round, reactive to light and accommodation III,IV, VI: ptosis not present, extra-ocular motions intact bilaterally V,VII: smile symmetric, facial light touch sensation normal bilaterally VIII: hearing normal bilaterally IX,X: gag reflex present XI: bilateral shoulder shrug XII: midline tongue extension Motor: Right : Upper extremity   5/5    Left:     Upper extremity   5/5  Lower extremity   5/5     Lower extremity   5/5 Tone and bulk:normal tone throughout; no atrophy noted Sensory: Pinprick and light touch intact throughout, bilaterally   Lab Results: Basic Metabolic Panel: Recent Labs  Lab 03/30/20 2103 04/01/20 0458  NA 140 141  K 3.0* 4.0  CL 105 111  CO2 27 25  GLUCOSE 91 93  BUN 17 12  CREATININE 0.76 0.58  CALCIUM 9.2 7.9*  MG  --  1.7    Liver Function Tests: Recent Labs  Lab  03/30/20 2103  AST 24  ALT 23  ALKPHOS 51  BILITOT 0.6  PROT 7.1  ALBUMIN 4.4   No results for input(s): LIPASE, AMYLASE in the last 168 hours. No results for input(s): AMMONIA in the last 168 hours.  CBC: Recent Labs  Lab 03/30/20 2103 04/01/20 0458  WBC 9.4 6.3  NEUTROABS  --  2.8  HGB 13.0 11.2*  HCT 37.0 33.4*  MCV 90.0 94.1  PLT 249 197    Cardiac Enzymes: Recent Labs  Lab 03/30/20 2103  CKTOTAL 199    Lipid Panel: Recent Labs  Lab 04/01/20 0458  CHOL 131  TRIG 31  HDL 59  CHOLHDL 2.2  VLDL 6  LDLCALC 66    CBG: No results for input(s): GLUCAP in the last 168 hours.  Microbiology: Results for orders placed or performed during the hospital encounter of 03/31/20  SARS Coronavirus 2 by RT PCR (hospital order, performed in [facility] hospital lab) Nasopharyngeal Nasopharyngeal Swab     Status: None   Collection Time: 03/31/20  9:55 AM   Specimen: Nasopharyngeal Swab  Result Value Ref Range Status   SARS Coronavirus 2 NEGATIVE NEGATIVE Final    Comment: (NOTE) SARS-CoV-2 target nucleic acids are NOT DETECTED.  The SARS-CoV-2 RNA is generally detectable in upper and lower respiratory specimens during the acute phase of  infection. The lowest concentration of SARS-CoV-2 viral copies this assay can detect is 250 copies / mL. A negative result does not preclude SARS-CoV-2 infection and should not be used as the sole basis for treatment or other patient management decisions.  A negative result may occur with improper specimen collection / handling, submission of specimen other than nasopharyngeal swab, presence of viral mutation(s) within the areas targeted by this assay, and inadequate number of viral copies (<250 copies / mL). A negative result must be combined with clinical observations, patient history, and epidemiological information.  Fact Sheet for Patients:   BoilerBrush.com.cy  Fact Sheet for Healthcare  Providers: https://pope.com/  This test is not yet approved or  cleared by the [place] FDA and has been authorized for detection and/or diagnosis of SARS-CoV-2 by FDA under an Emergency Use Authorization (EUA).  This EUA will remain in effect (meaning this test can be used) for the duration of the COVID-19 declaration under Section 564(b)(1) of the Act, 21 U.S.C. section 360bbb-3(b)(1), unless the authorization is terminated or revoked sooner.  Performed at [facility], [address] [place], [place] [address]     Coagulation Studies: No results for input(s): LABPROT, INR in the last 72 hours.  Imaging: CT Head Wo Contrast  Result Date: 03/30/2020 CLINICAL DATA:  Acute headache with normal neuro exam. EXAM: CT HEAD WITHOUT CONTRAST TECHNIQUE: Contiguous axial images were obtained from the base of the skull through the vertex without intravenous contrast. COMPARISON:  None. FINDINGS: Brain: Low lying cerebellar tonsils with crowding of the foramen magnum. Inferior extent not entirely included in the field of view. No intracranial hemorrhage, mass effect, or midline shift. No hydrocephalus. No evidence of territorial infarct or acute ischemia. No extra-axial or intracranial fluid collection. Vascular: No hyperdense vessel or unexpected calcification. Skull: No fracture or focal lesion. Sinuses/Orbits: Paranasal sinuses and mastoid air cells are clear. The visualized orbits are unremarkable. Other: None. IMPRESSION: 1. No acute intracranial abnormality. 2. Low lying cerebellar tonsils with crowding of the foramen magnum, may be normal variant or can be seen with Chiari I malformation. Recommend nonemergent MRI for further evaluation. Electronically Signed   By: Narda Rutherford M.D.   On: 03/30/2020 21:37   MR ANGIO HEAD WO CONTRAST  Result Date: 03/31/2020 CLINICAL DATA:  Headaches and word-finding difficulty. EXAM: MRI HEAD WITHOUT CONTRAST MRA HEAD  WITHOUT CONTRAST TECHNIQUE: Multiplanar, multiecho pulse sequences of the brain and surrounding structures were obtained without intravenous contrast. Angiographic images of the head were obtained using MRA technique without contrast. COMPARISON:  None. FINDINGS: MRI HEAD FINDINGS BRAIN: There is no acute infarct, acute hemorrhage or extra-axial collection. The white matter signal is normal for the patient's age. The cerebral and cerebellar volume are age-appropriate. There is no hydrocephalus. Blood-sensitive sequences show no chronic microhemorrhage or superficial siderosis. Cerebellar tonsils extend 14 mm below the foramen magnum. SKULL AND UPPER CERVICAL SPINE: There is a syrinx of the upper cervical spine measuring 4 mm in transverse dimension. SINUSES/ORBITS: No fluid levels or advanced mucosal thickening. No mastoid or middle ear effusion. The orbits are normal. MRA HEAD FINDINGS POSTERIOR CIRCULATION: --Basilar artery: Normal. --Posterior cerebral arteries: Normal. Both originate from the basilar artery. --Superior cerebellar arteries: Normal. --Inferior cerebellar arteries: Normal anterior and posterior inferior cerebellar arteries. ANTERIOR CIRCULATION: --Intracranial internal carotid arteries: There is a laterally projecting aneurysm of the left ICA ophthalmic segment measuring 3 mm at its base and 5 mm in widest dimension. --Anterior cerebral arteries: Normal. Both A1  segments are present. Patent anterior communicating artery. --Middle cerebral arteries: Normal. --Posterior communicating arteries: Present on the left, absent on the right. 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 2. 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 3. No acute abnormality. Electronically Signed   By: Ulyses Jarred M.D.   On: 03/31/2020 03:16   MR BRAIN WO CONTRAST  Result Date: 03/31/2020 CLINICAL DATA:  Headaches and word-finding difficulty. EXAM: MRI HEAD WITHOUT CONTRAST MRA HEAD WITHOUT  CONTRAST TECHNIQUE: Multiplanar, multiecho pulse sequences of the brain and surrounding structures were obtained without intravenous contrast. Angiographic images of the head were obtained using MRA technique without contrast. COMPARISON:  None. FINDINGS: MRI HEAD FINDINGS BRAIN: There is no acute infarct, acute hemorrhage or extra-axial collection. The white matter signal is normal for the patient's age. The cerebral and cerebellar volume are age-appropriate. There is no hydrocephalus. Blood-sensitive sequences show no chronic microhemorrhage or superficial siderosis. Cerebellar tonsils extend 14 mm below the foramen magnum. SKULL AND UPPER CERVICAL SPINE: There is a syrinx of the upper cervical spine measuring 4 mm in transverse dimension. SINUSES/ORBITS: No fluid levels or advanced mucosal thickening. No mastoid or middle ear effusion. The orbits are normal. MRA HEAD FINDINGS POSTERIOR CIRCULATION: --Basilar artery: Normal. --Posterior cerebral arteries: Normal. Both originate from the basilar artery. --Superior cerebellar arteries: Normal. --Inferior cerebellar arteries: Normal anterior and posterior inferior cerebellar arteries. ANTERIOR CIRCULATION: --Intracranial internal carotid arteries: There is a laterally projecting aneurysm of the left ICA ophthalmic segment measuring 3 mm at its base and 5 mm in widest dimension. --Anterior cerebral arteries: Normal. Both A1 segments are present. Patent anterior communicating artery. --Middle cerebral arteries: Normal. --Posterior communicating arteries: Present on the left, absent on the right. 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 2. 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 3. No acute abnormality. Electronically Signed   By: Ulyses Jarred M.D.   On: 03/31/2020 03:16   ECHOCARDIOGRAM COMPLETE  Result Date: 03/31/2020    ECHOCARDIOGRAM REPORT   Patient Name:   RUEY STORER Date of Exam: 03/31/2020 Medical Rec #:  [id number]      Height:       64.0 in Accession #:    [id number]    Weight:       120.0 lb Date of Birth:  1976-09-26     BSA:          1.575 m Patient Age:    63 years      BP:           103/82 mmHg Patient Gender: F             HR:           99 bpm. Exam Location:  [facility] Procedure: 2D Echo, Cardiac Doppler and Color Doppler Indications:     Stroke 434.91  History:         Patient has no prior history of Echocardiogram examinations.                  Known health problems-none.  Sonographer:     Sherrie Sport RDCS (AE) Referring Phys:  [id number] Athena Masse Diagnosing Phys: Ida Rogue MD  Sonographer Comments: Image acquisition challenging due to patient body habitus. IMPRESSIONS  1. Left ventricular ejection fraction, by estimation, is 60 to 65%. The left ventricle has normal function. The left ventricle has no regional wall motion abnormalities. Left ventricular diastolic parameters are consistent with Grade I diastolic dysfunction (  impaired relaxation).  2. Right ventricular systolic function is normal. The right ventricular size is normal. There is normal pulmonary artery systolic pressure.  3. The inferior vena cava is normal in size with greater than 50% respiratory variability, suggesting right atrial pressure of 3 mmHg. FINDINGS  Left Ventricle: Left ventricular ejection fraction, by estimation, is 60 to 65%. The left ventricle has normal function. The left ventricle has no regional wall motion abnormalities. The left ventricular internal cavity size was normal in size. There is  no left ventricular hypertrophy. Left ventricular diastolic parameters are consistent with Grade I diastolic dysfunction (impaired relaxation). Right Ventricle: The right ventricular size is normal. No increase in right ventricular wall thickness. Right ventricular systolic function is normal. There is normal pulmonary artery systolic pressure. The tricuspid regurgitant velocity is 1.95 m/s, and  with an assumed right atrial pressure of 10 mmHg,  the estimated right ventricular systolic pressure is 25.2 mmHg. Left Atrium: Left atrial size was normal in size. Right Atrium: Right atrial size was normal in size. Pericardium: There is no evidence of pericardial effusion. Mitral Valve: The mitral valve is normal in structure. Normal mobility of the mitral valve leaflets. No evidence of mitral valve regurgitation. No evidence of mitral valve stenosis. Tricuspid Valve: The tricuspid valve is normal in structure. Tricuspid valve regurgitation is not demonstrated. No evidence of tricuspid stenosis. Aortic Valve: The aortic valve was not well visualized. Aortic valve regurgitation is not visualized. No aortic stenosis is present. Aortic valve mean gradient measures 2.5 mmHg. Aortic valve peak gradient measures 4.8 mmHg. Aortic valve area, by VTI measures 2.59 cm. Pulmonic Valve: The pulmonic valve was normal in structure. Pulmonic valve regurgitation is not visualized. No evidence of pulmonic stenosis. Aorta: The aortic root is normal in size and structure. Venous: The inferior vena cava is normal in size with greater than 50% respiratory variability, suggesting right atrial pressure of 3 mmHg. IAS/Shunts: No atrial level shunt detected by color flow Doppler.  LEFT VENTRICLE PLAX 2D LVIDd:         3.28 cm  Diastology LVIDs:         2.28 cm  LV e' lateral:   19.90 cm/s LV PW:         0.99 cm  LV E/e' lateral: 5.4 LV IVS:        0.58 cm  LV e' medial:    10.10 cm/s LVOT diam:     2.00 cm  LV E/e' medial:  10.7 LV SV:         49 LV SV Index:   31 LVOT Area:     3.14 cm  RIGHT VENTRICLE RV Basal diam:  2.51 cm LEFT ATRIUM           Index      RIGHT ATRIUM          Index LA diam:      1.80 cm 1.14 cm/m RA Area:     9.08 cm LA Vol (A2C): 6.4 ml  4.06 ml/m RA Volume:   17.00 ml 10.80 ml/m LA Vol (A4C): 15.1 ml 9.59 ml/m  AORTIC VALVE                   PULMONIC VALVE AV Area (Vmax):    2.14 cm    PV Vmax:        0.91 m/s AV Area (Vmean):   2.04 cm    PV Peak grad:    3.3 mmHg AV Area (VTI):  2.59 cm    RVOT Peak grad: 3 mmHg AV Vmax:           110.00 cm/s AV Vmean:          72.450 cm/s AV VTI:            0.188 m AV Peak Grad:      4.8 mmHg AV Mean Grad:      2.5 mmHg LVOT Vmax:         75.10 cm/s LVOT Vmean:        47.100 cm/s LVOT VTI:          0.155 m LVOT/AV VTI ratio: 0.82  AORTA Ao Root diam: 2.80 cm MITRAL VALVE                TRICUSPID VALVE MV Area (PHT): 4.89 cm     TR Peak grad:   15.2 mmHg MV Decel Time: 155 msec     TR Vmax:        195.00 cm/s MV E velocity: 108.00 cm/s MV A velocity: 59.10 cm/s   SHUNTS MV E/A ratio:  1.83         Systemic VTI:  0.16 m                             Systemic Diam: 2.00 cm Julien Nordmann MD Electronically signed by Julien Nordmann MD Signature Date/Time: 03/31/2020/3:19:08 PM    Final     Medications:  I have reviewed the patient's current medications. Scheduled: .  stroke: mapping our early stages of recovery book   Does not apply Once    Assessment/Plan: 44 year old female with no significant PMH presenting with altered mental status and difficulty with speech.  Patient improved today with no further word finding difficulties.  MRI of the brain personally reviewed and shows a Chiari I with a small (48mm) cervical syrinx and a 3X34mm left ophthalmic artery aneurysm.  I do not feel either of these findings are related to the patient's symptoms but they will require follow up.  Presentation may be secondary to heat exposure.     Recommendations: 1. EEG pending.  If unremarkable, patient stable for discharge and may follow up with her PCP on an outpatient basis 2. Neurosurgery follow up scheduled for 6/22 3. Telemetry 4. Frequent neuro checks.   LOS: 0 days   Thana Farr, MD Neurology [phone] 04/01/2020  9:56 AM

## 2020-04-01 NOTE — Discharge Summary (Signed)
Discharge Summary  Colleen Sanchez ZOX:[id number] DOB: 1976/08/02  [facility]: [facility]  Admit date: 03/31/2020 Discharge date: 04/01/2020  Time spent:  30mins  Recommendations for Outpatient Follow-up:  1. F/u with PCP within a week  for hospital discharge follow up, repeat cbc/bmp at follow up 2. Follow-up with neurosurgery  Discharge Diagnoses:  Active Hospital Problems   Diagnosis Date Noted  . Acute headache 03/31/2020  . 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 Acute confusion 03/31/2020  . Acute focal neurological deficit 03/31/2020  . 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 Hypokalemia 03/31/2020  . Headache 03/31/2020    Resolved Hospital Problems  No resolved problems to display.    Discharge Condition: stable  Diet recommendation: Regular diet  Filed Weights   03/30/20 2053  Weight: 54.4 kg    History of present illness: (Per admitting MD Dr. Para Marchuncan) PCP: [facility]   Patient coming from: Home  I have personally briefly reviewed patient's old medical [facility]  Chief Complaint: Headache, confusion  HPI: Colleen Sanchez is a 44 y.o. female with no significant past medical history who was brought to the emergency room earlier today after her husband noticed that she was confused while sitting looking at TV.  He said that she appeared to not understand what was going on.  He said he asked so the president was and she was unable to answer.  He noticed that she had difficulty answering other questions and decided to bring him to the emergency room.  On the way here she developed a headache.  She has no prior history of headaches.  He states that they waited several hours in the waiting room where she vomited 3 times.  He said it appeared that every time she got up after being seated for long time she would vomit.  Husband provides most of the history.  He said she was working outdoors in  the heat all morning and drinking very cold water and he thinks this might have affected her.  After several hours waiting to be evaluated in the emergency room there was no significant progression or improvement in her symptoms.  ED Course: Upon arrival at 20:51 vitals were within normal limits.  Blood work unremarkable except for slightly decreased potassium of 3.  NIHSS was 1.  Head CT showed no acute intracranial abnormality but suggested Chiari I malformation with recommendation for nonemergent MRI.  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  Hospitalist consulted for admission.  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  -EEG unremarkable -neurology consulted who do not feel either of these findings are related to the patient's symptoms but they will require follow up. Neurology suggestsPresentation may be secondary to heat exposure.   a Chiari I with a small (4mm) cervical syrinx and a 3X565mm left ophthalmic artery aneurysm.  Neurosurgery follow up scheduled for 6/22  Hypokalemia:  Likely due to vomiting,  report vomited x3 in the ED waiting room -k replaced and normalized No more vomiting since admitted to the floor Tolerating regular diet   Low normal blood pressure, reports chronic, orthostatic vital signs unremarkable, f/u with pcp to consider checking random cortisol level    Procedures:  EEG  Consultations:  neurology  Discharge Exam: BP 110/72 (BP Location: Right Arm)   Pulse 69    Temp 98.2 F (36.8 C) (Oral)   Resp 14   Ht  (1.626 m)   Wt 54.4 kg   SpO2 100%   BMI 20.60 kg/m   General: NAD Cardiovascular: RRR Respiratory: CTABL  Discharge Instructions You were cared for by a hospitalist during your hospital stay. If you have any questions about your discharge medications or the care you received while you were in the hospital after you are discharged, you can call the unit and asked to speak with the hospitalist on call if the hospitalist that took care of you is not available. Once you are discharged, your primary care physician will handle any further medical issues. Please note that NO REFILLS for any discharge medications will be authorized once you are discharged, as it is imperative that you return to your primary care physician (or establish a relationship with a primary care physician if you do not have one) for your aftercare needs so that they can reassess your need for medications and monitor your lab values.  Discharge Instructions    Diet general   Complete by: As directed    Increase activity slowly   Complete by: As directed      Allergies as of 04/01/2020   No Known Allergies     Medication List    You have not been prescribed any medications.    No Known Allergies  Follow-up Information    Venetia Night, MD Follow up in 1 week(s).   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  Contact information: [address] [place] [address] [phone]        [facility] Follow up.   Why: hospital disharge follow up, consider random cortisol level test for low blood pressure. Contact information: [address] [place] [address] [phone]                The results of significant diagnostics from this hospitalization (including imaging, microbiology, ancillary and laboratory) are listed below for reference.     Significant Diagnostic Studies: EEG  Result Date: 04/01/2020 Thana Farr, MD     04/01/2020  2:57 PM ELECTROENCEPHALOGRAM REPORT Patient: Colleen Sanchez       Room #: 101A-AA EEG No. ID: [id number] Age: 44 y.o.        Sex: female Requesting Physician: Roda Shutters Report Date:  04/01/2020       Interpreting Physician: Thana Farr History: Colleen Sanchez is an 44 y.o. female with episode of confusion Medications: None Conditions of Recording:  This is a 21 channel routine scalp EEG performed with bipolar and monopolar montages arranged in accordance to the international 10/20 system of electrode placement. One channel was dedicated to EKG recording. The patient is in the awake, drowsy and asleep states. Description:  The waking background activity consists of a low voltage, symmetrical, fairly well organized, 10 Hz alpha activity, seen from the parieto-occipital and posterior temporal regions.  Low voltage fast activity, poorly organized, is seen anteriorly and is at times  superimposed on more posterior regions.  A mixture of theta and alpha rhythms are seen from the central and temporal regions. The patient drowses with slowing to irregular, low voltage theta and beta activity.  The patient goes in to a light sleep with symmetrical sleep spindles, vertex central sharp transients and irregular slow activity.  No epileptiform activity is noted.  Hyperventilation was not performed.  Intermittent photic stimulation was performed but failed to illicit any change in the tracing.  IMPRESSION: Normal electroencephalogram, awake, asleep and with activation procedures. There are no focal lateralizing or epileptiform features. Thana Farr, MD Neurology [phone] 04/01/2020, 2:40 PM   CT Head Wo Contrast  Result Date: 03/30/2020 CLINICAL DATA:  Acute headache with normal neuro exam. EXAM: CT HEAD WITHOUT CONTRAST TECHNIQUE: Contiguous axial images were obtained from the base of the skull through the vertex without  intravenous contrast. COMPARISON:  None. FINDINGS: Brain: Low lying cerebellar tonsils with crowding of the foramen magnum. Inferior extent not entirely included in the field of view. No intracranial hemorrhage, mass effect, or midline shift. No hydrocephalus. No evidence of territorial infarct or acute ischemia. No extra-axial or intracranial fluid collection. Vascular: No hyperdense vessel or unexpected calcification. Skull: No fracture or focal lesion. Sinuses/Orbits: Paranasal sinuses and mastoid air cells are clear. The visualized orbits are unremarkable. Other: None. IMPRESSION: 1. No acute intracranial abnormality. 2. Low lying cerebellar tonsils with crowding of the foramen magnum, may be normal variant or can be seen with Chiari I malformation. Recommend nonemergent MRI for further evaluation. Electronically Signed   By: Narda Rutherford M.D.   On: 03/30/2020 21:37   MR ANGIO HEAD WO CONTRAST  Result Date: 03/31/2020 CLINICAL DATA:  Headaches and word-finding difficulty. EXAM: MRI HEAD WITHOUT CONTRAST MRA HEAD WITHOUT CONTRAST TECHNIQUE: Multiplanar, multiecho pulse sequences of the brain and surrounding structures were obtained without intravenous contrast. Angiographic images of the head were obtained using MRA technique without contrast. COMPARISON:  None. FINDINGS: MRI HEAD FINDINGS BRAIN: There is no acute infarct, acute hemorrhage or extra-axial collection. The white matter signal is normal for the patient's age. The cerebral and cerebellar volume are age-appropriate. There is no hydrocephalus. Blood-sensitive sequences show no chronic microhemorrhage or superficial siderosis. Cerebellar tonsils extend 14 mm below the foramen magnum. SKULL AND UPPER CERVICAL SPINE: There is a syrinx of the upper cervical spine measuring 4 mm in transverse dimension. SINUSES/ORBITS: No fluid levels or advanced mucosal thickening. No mastoid or middle ear effusion. The orbits are normal. MRA HEAD FINDINGS POSTERIOR  CIRCULATION: --Basilar artery: Normal. --Posterior cerebral arteries: Normal. Both originate from the basilar artery. --Superior cerebellar arteries: Normal. --Inferior cerebellar arteries: Normal anterior and posterior inferior cerebellar arteries. ANTERIOR CIRCULATION: --Intracranial internal carotid arteries: There is a laterally projecting aneurysm of the left ICA ophthalmic segment measuring 3 mm at its base and 5 mm in widest dimension. --Anterior cerebral arteries: Normal. Both A1 segments are present. Patent anterior communicating artery. --Middle cerebral arteries: Normal. --Posterior communicating arteries: Present on the left, absent on the right. 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 2. 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 3. No acute abnormality. Electronically Signed   By: Deatra Robinson M.D.   On: 03/31/2020 03:16   MR BRAIN WO CONTRAST  Result Date: 03/31/2020 CLINICAL DATA:  Headaches and word-finding difficulty. EXAM: MRI HEAD WITHOUT CONTRAST MRA HEAD WITHOUT CONTRAST TECHNIQUE: Multiplanar, multiecho pulse sequences of the brain and surrounding structures were obtained without intravenous contrast. Angiographic images of the  head were obtained using MRA technique without contrast. COMPARISON:  None. FINDINGS: MRI HEAD FINDINGS BRAIN: There is no acute infarct, acute hemorrhage or extra-axial collection. The white matter signal is normal for the patient's age. The cerebral and cerebellar volume are age-appropriate. There is no hydrocephalus. Blood-sensitive sequences show no chronic microhemorrhage or superficial siderosis. Cerebellar tonsils extend 14 mm below the foramen magnum. SKULL AND UPPER CERVICAL SPINE: There is a syrinx of the upper cervical spine measuring 4 mm in transverse dimension. SINUSES/ORBITS: No fluid levels or advanced mucosal thickening. No mastoid or middle ear effusion. The orbits are normal. MRA HEAD FINDINGS POSTERIOR CIRCULATION:  --Basilar artery: Normal. --Posterior cerebral arteries: Normal. Both originate from the basilar artery. --Superior cerebellar arteries: Normal. --Inferior cerebellar arteries: Normal anterior and posterior inferior cerebellar arteries. ANTERIOR CIRCULATION: --Intracranial internal carotid arteries: There is a laterally projecting aneurysm of the left ICA ophthalmic segment measuring 3 mm at its base and 5 mm in widest dimension. --Anterior cerebral arteries: Normal. Both A1 segments are present. Patent anterior communicating artery. --Middle cerebral arteries: Normal. --Posterior communicating arteries: Present on the left, absent on the right. 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 2. 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 3. No acute abnormality. Electronically Signed   By: Deatra Robinson M.D.   On: 03/31/2020 03:16   ECHOCARDIOGRAM COMPLETE  Result Date: 03/31/2020    ECHOCARDIOGRAM REPORT   Patient Name:   Colleen Sanchez Date of Exam: 03/31/2020 Medical Rec #:  [id number]     Height:       64.0 in Accession #:    [id number]    Weight:       120.0 lb Date of Birth:  09-Aug-1976     BSA:          1.575 m Patient Age:    43 years      BP:           103/82 mmHg Patient Gender: F             HR:           99 bpm. Exam Location:  [facility] Procedure: 2D Echo, Cardiac Doppler and Color Doppler Indications:     Stroke 434.91  History:         Patient has no prior history of Echocardiogram examinations.                  Known health problems-none.  Sonographer:     Cristela Blue RDCS (AE) Referring Phys:  [id number] Andris Baumann Diagnosing Phys: Julien Nordmann MD  Sonographer Comments: Image acquisition challenging due to patient body habitus. IMPRESSIONS  1. Left ventricular ejection fraction, by estimation, is 60 to 65%. The left ventricle has normal function. The left ventricle has no regional wall motion abnormalities. Left ventricular diastolic parameters are consistent with Grade I  diastolic dysfunction (impaired relaxation).  2. Right ventricular systolic function is normal. The right ventricular size is normal. There is normal pulmonary artery systolic pressure.  3. The inferior vena cava is normal in size with greater than 50% respiratory variability, suggesting right atrial pressure of 3 mmHg. FINDINGS  Left Ventricle: Left ventricular ejection fraction, by estimation, is 60 to 65%. The left ventricle has normal function. The left ventricle has no regional wall motion abnormalities. The left ventricular internal cavity size was normal in size. There is  no left ventricular hypertrophy. Left ventricular diastolic parameters are consistent with Grade I diastolic dysfunction (impaired  relaxation). Right Ventricle: The right ventricular size is normal. No increase in right ventricular wall thickness. Right ventricular systolic function is normal. There is normal pulmonary artery systolic pressure. The tricuspid regurgitant velocity is 1.95 m/s, and  with an assumed right atrial pressure of 10 mmHg, the estimated right ventricular systolic pressure is 25.2 mmHg. Left Atrium: Left atrial size was normal in size. Right Atrium: Right atrial size was normal in size. Pericardium: There is no evidence of pericardial effusion. Mitral Valve: The mitral valve is normal in structure. Normal mobility of the mitral valve leaflets. No evidence of mitral valve regurgitation. No evidence of mitral valve stenosis. Tricuspid Valve: The tricuspid valve is normal in structure. Tricuspid valve regurgitation is not demonstrated. No evidence of tricuspid stenosis. Aortic Valve: The aortic valve was not well visualized. Aortic valve regurgitation is not visualized. No aortic stenosis is present. Aortic valve mean gradient measures 2.5 mmHg. Aortic valve peak gradient measures 4.8 mmHg. Aortic valve area, by VTI measures 2.59 cm. Pulmonic Valve: The pulmonic valve was normal in structure. Pulmonic valve regurgitation  is not visualized. No evidence of pulmonic stenosis. Aorta: The aortic root is normal in size and structure. Venous: The inferior vena cava is normal in size with greater than 50% respiratory variability, suggesting right atrial pressure of 3 mmHg. IAS/Shunts: No atrial level shunt detected by color flow Doppler.  LEFT VENTRICLE PLAX 2D LVIDd:         3.28 cm  Diastology LVIDs:         2.28 cm  LV e' lateral:   19.90 cm/s LV PW:         0.99 cm  LV E/e' lateral: 5.4 LV IVS:        0.58 cm  LV e' medial:    10.10 cm/s LVOT diam:     2.00 cm  LV E/e' medial:  10.7 LV SV:         49 LV SV Index:   31 LVOT Area:     3.14 cm  RIGHT VENTRICLE RV Basal diam:  2.51 cm LEFT ATRIUM           Index      RIGHT ATRIUM          Index LA diam:      1.80 cm 1.14 cm/m RA Area:     9.08 cm LA Vol (A2C): 6.4 ml  4.06 ml/m RA Volume:   17.00 ml 10.80 ml/m LA Vol (A4C): 15.1 ml 9.59 ml/m  AORTIC VALVE                   PULMONIC VALVE AV Area (Vmax):    2.14 cm    PV Vmax:        0.91 m/s AV Area (Vmean):   2.04 cm    PV Peak grad:   3.3 mmHg AV Area (VTI):     2.59 cm    RVOT Peak grad: 3 mmHg AV Vmax:           110.00 cm/s AV Vmean:          72.450 cm/s AV VTI:            0.188 m AV Peak Grad:      4.8 mmHg AV Mean Grad:      2.5 mmHg LVOT Vmax:         75.10 cm/s LVOT Vmean:        47.100 cm/s LVOT VTI:  0.155 m LVOT/AV VTI ratio: 0.82  AORTA Ao Root diam: 2.80 cm MITRAL VALVE                TRICUSPID VALVE MV Area (PHT): 4.89 cm     TR Peak grad:   15.2 mmHg MV Decel Time: 155 msec     TR Vmax:        195.00 cm/s MV E velocity: 108.00 cm/s MV A velocity: 59.10 cm/s   SHUNTS MV E/A ratio:  1.83         Systemic VTI:  0.16 m                             Systemic Diam: 2.00 cm Ida Rogue MD Electronically signed by Ida Rogue MD Signature Date/Time: 03/31/2020/3:19:08 PM    Final     Microbiology: Recent Results (from the past 240 hour(s))  SARS Coronavirus 2 by RT PCR (hospital order, performed in [place] hospital lab) Nasopharyngeal Nasopharyngeal Swab     Status: None   Collection Time: 03/31/20  9:55 AM   Specimen: Nasopharyngeal Swab  Result Value Ref Range Status   SARS Coronavirus 2 NEGATIVE NEGATIVE Final    Comment: (NOTE) SARS-CoV-2 target nucleic acids are NOT DETECTED.  The SARS-CoV-2 RNA is generally detectable in upper and lower respiratory specimens during the acute phase of infection. The lowest concentration of SARS-CoV-2 viral copies this assay can detect is 250 copies / mL. A negative result does not preclude SARS-CoV-2 infection and should not be used as the sole basis for treatment or other patient management decisions.  A negative result may occur with improper specimen collection / handling, submission of specimen other than nasopharyngeal swab, presence of viral mutation(s) within the areas targeted by this assay, and inadequate number of viral copies (<250 copies / mL). A negative result must be combined with clinical observations, patient history, and epidemiological information.  Fact Sheet for Patients:   StrictlyIdeas.no  Fact Sheet for Healthcare Providers: BankingDealers.co.za  This test is not yet approved or  cleared by the [place] FDA and has been authorized for detection and/or diagnosis of SARS-CoV-2 by FDA under an Emergency Use Authorization (EUA).  This EUA will remain in effect (meaning this test can be used) for the duration of the COVID-19 declaration under Section 564(b)(1) of the Act, 21 U.S.C. section 360bbb-3(b)(1), unless the authorization is terminated or revoked sooner.  Performed at [facility], [address] [place], [place] [address]      Labs: Basic Metabolic Panel: Recent Labs  Lab 03/30/20 2103 04/01/20 0458  NA 140 141  K 3.0* 4.0  CL 105 111  CO2 27 25  GLUCOSE 91 93  BUN 17 12  CREATININE 0.76 0.58  CALCIUM 9.2 7.9*  MG  --  1.7   Liver  Function Tests: Recent Labs  Lab 03/30/20 2103  AST 24  ALT 23  ALKPHOS 51  BILITOT 0.6  PROT 7.1  ALBUMIN 4.4   No results for input(s): LIPASE, AMYLASE in the last 168 hours. No results for input(s): AMMONIA in the last 168 hours. CBC: Recent Labs  Lab 03/30/20 2103 04/01/20 0458  WBC 9.4 6.3  NEUTROABS  --  2.8  HGB 13.0 11.2*  HCT 37.0 33.4*  MCV 90.0 94.1  PLT 249 197   Cardiac Enzymes: Recent Labs  Lab 03/30/20 2103  CKTOTAL 199   BNP: BNP (last 3 results) No  results for input(s): BNP in the last 8760 hours.  ProBNP (last 3 results) No results for input(s): PROBNP in the last 8760 hours.  CBG: No results for input(s): GLUCAP in the last 168 hours.     Signed:  Albertine Grates MD, PhD, FACP  [facility] 04/01/2020, 3:35 PM

## 2021-03-14 ENCOUNTER — Other Ambulatory Visit: Payer: Self-pay | Admitting: Neurosurgery

## 2021-03-14 ENCOUNTER — Other Ambulatory Visit (HOSPITAL_COMMUNITY): Payer: Self-pay | Admitting: Neurosurgery

## 2021-03-14 DIAGNOSIS — I671 Cerebral aneurysm, nonruptured: Secondary | ICD-10-CM

## 2021-03-14 DIAGNOSIS — G935 Compression of brain: Secondary | ICD-10-CM

## 2021-04-02 ENCOUNTER — Emergency Department: Payer: BC Managed Care – PPO

## 2021-04-02 ENCOUNTER — Encounter: Payer: Self-pay | Admitting: Radiology

## 2021-04-02 ENCOUNTER — Emergency Department
Admission: EM | Admit: 2021-04-02 | Discharge: 2021-04-02 | Disposition: A | Discharge status: Home / Self Care | Payer: BC Managed Care – PPO | Attending: Emergency Medicine | Admitting: Emergency Medicine

## 2021-04-02 ENCOUNTER — Other Ambulatory Visit: Payer: Self-pay

## 2021-04-02 DIAGNOSIS — R059 Cough, unspecified: Secondary | ICD-10-CM | POA: Diagnosis present

## 2021-04-02 DIAGNOSIS — R5383 Other fatigue: Secondary | ICD-10-CM | POA: Insufficient documentation

## 2021-04-02 DIAGNOSIS — J189 Pneumonia, unspecified organism: Secondary | ICD-10-CM | POA: Diagnosis not present

## 2021-04-02 DIAGNOSIS — Z20822 Contact with and (suspected) exposure to covid-19: Secondary | ICD-10-CM | POA: Diagnosis not present

## 2021-04-02 LAB — RESP PANEL BY RT-PCR (FLU A&B, COVID) ARPGX2
Influenza A by PCR: NEGATIVE
Influenza B by PCR: NEGATIVE
SARS Coronavirus 2 by RT PCR: NEGATIVE

## 2021-04-02 LAB — CBC
HCT: 34 % — ABNORMAL LOW (ref 36.0–46.0)
Hemoglobin: 11.7 g/dL — ABNORMAL LOW (ref 12.0–15.0)
MCH: 31.1 pg (ref 26.0–34.0)
MCHC: 34.4 g/dL (ref 30.0–36.0)
MCV: 90.4 fL (ref 80.0–100.0)
Platelets: 234 10*3/uL (ref 150–400)
RBC: 3.76 MIL/uL — ABNORMAL LOW (ref 3.87–5.11)
RDW: 11.9 % (ref 11.5–15.5)
WBC: 10 10*3/uL (ref 4.0–10.5)
nRBC: 0 % (ref 0.0–0.2)

## 2021-04-02 LAB — BASIC METABOLIC PANEL
Anion gap: 7 (ref 5–15)
BUN: 10 mg/dL (ref 6–20)
CO2: 28 mmol/L (ref 22–32)
Calcium: 9 mg/dL (ref 8.9–10.3)
Chloride: 103 mmol/L (ref 98–111)
Creatinine, Ser: 0.64 mg/dL (ref 0.44–1.00)
GFR, Estimated: >60 mL/min (ref >60)
Glucose, Bld: 108 mg/dL — ABNORMAL HIGH (ref 70–99)
Potassium: 3.2 mmol/L — ABNORMAL LOW (ref 3.5–5.1)
Sodium: 138 mmol/L (ref 135–145)

## 2021-04-02 LAB — PROCALCITONIN: Procalcitonin: <0.1 ng/mL

## 2021-04-02 LAB — TROPONIN I (HIGH SENSITIVITY)
Troponin I (High Sensitivity): 4 ng/L (ref <18)
Troponin I (High Sensitivity): 2 ng/L (ref <18)
Troponin I (High Sensitivity): 2 ng/L (ref <18)

## 2021-04-02 LAB — POC URINE PREG, ED: Preg Test, Ur: NEGATIVE

## 2021-04-02 LAB — BRAIN NATRIURETIC PEPTIDE: B Natriuretic Peptide: 29.5 pg/mL (ref 0.0–100.0)

## 2021-04-02 LAB — D-DIMER, QUANTITATIVE: D-Dimer, Quant: <0.27 ug/mL-FEU (ref 0.00–0.50)

## 2021-04-02 IMAGING — CR DG CHEST 2V
2 series · 2 of 2 positions shown · non-contrast
Comparison: [DATE]

CLINICAL DATA: Chest pain on the right with deep breaths.

EXAM:
CHEST - 2 VIEW

[chest pa]
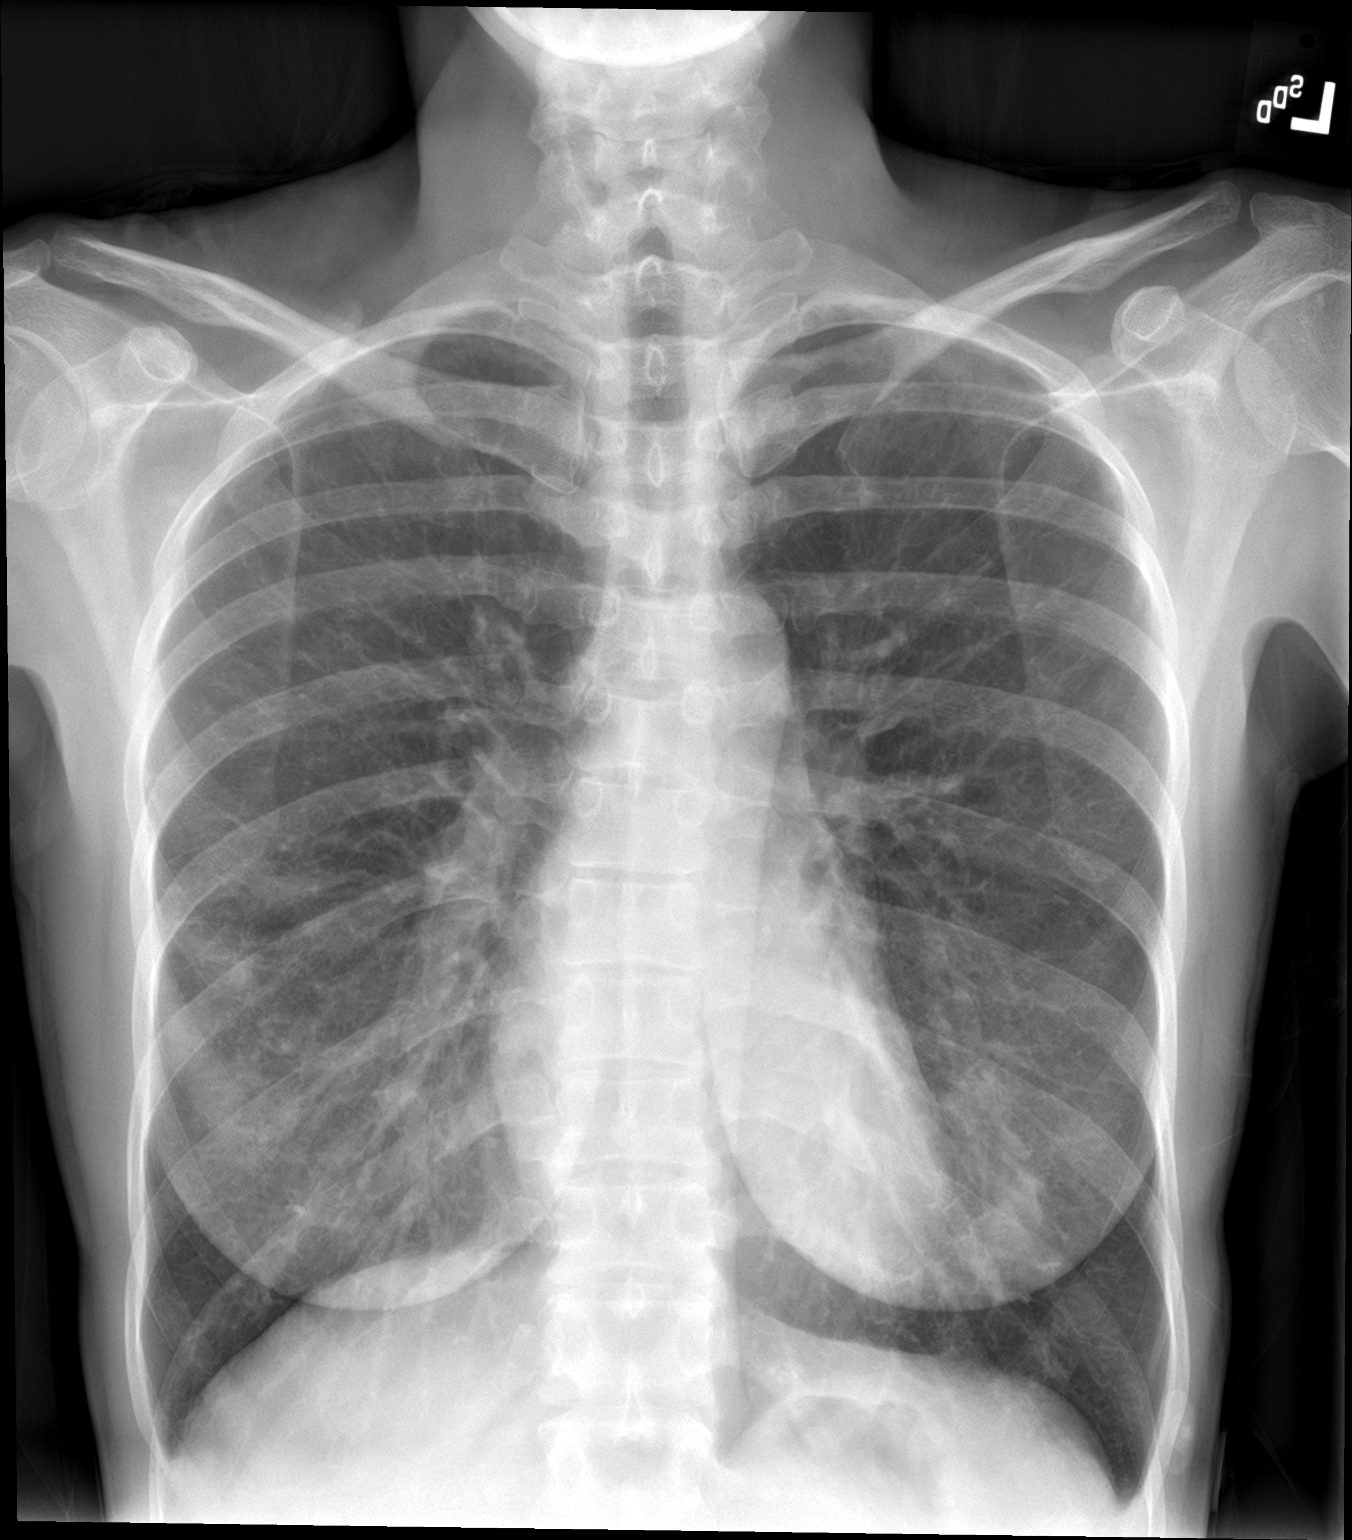

[chest lat]
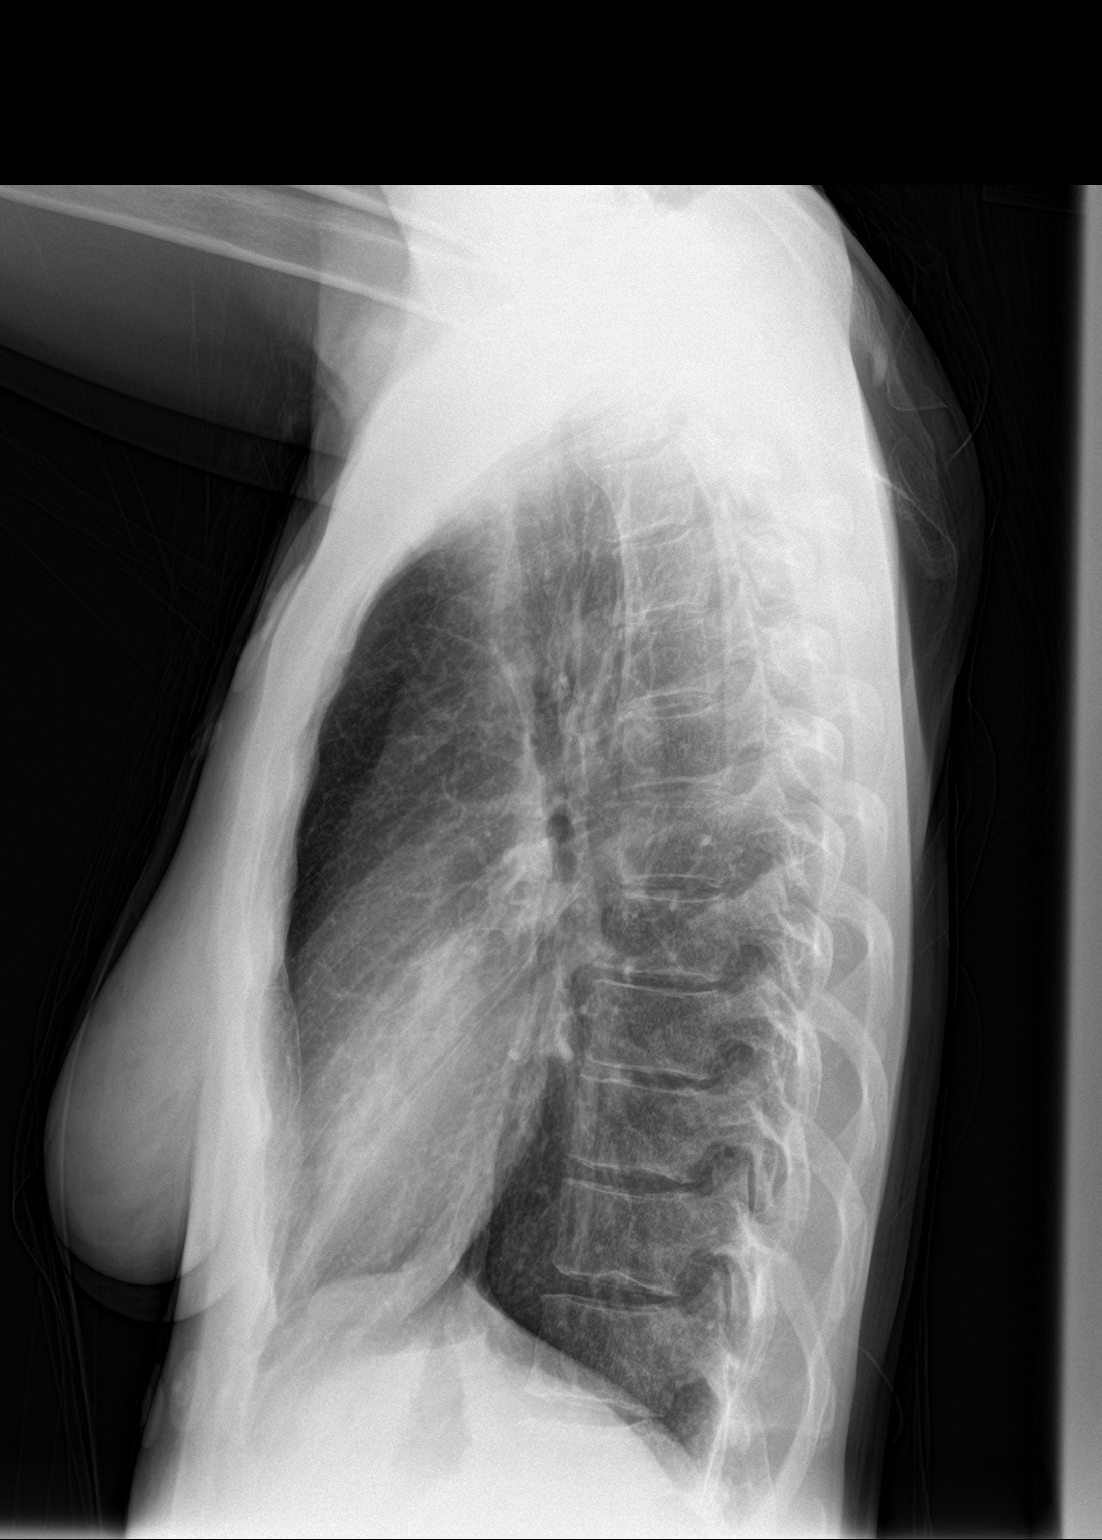

[2 of 2 positions shown; findings below may reference images not displayed]

FINDINGS: Reticulonodular opacity in the lower lungs bilaterally. No edema,
effusion, or pneumothorax. Normal heart size and mediastinal
contours.
IMPRESSION: Infiltrates at the lung bases.

## 2021-04-02 MED ORDER — KETOROLAC TROMETHAMINE 30 MG/ML IJ SOLN
30.0000 mg | Freq: Once | INTRAMUSCULAR | Status: AC
  Administered 2021-04-02: 30 mg via INTRAMUSCULAR
  Filled 2021-04-02: qty 1

## 2021-04-02 MED ORDER — AZITHROMYCIN 250 MG PO TABS: ORAL_TABLET | ORAL | 0 refills | Status: AC

## 2021-04-02 MED ORDER — CEFPODOXIME PROXETIL 200 MG PO TABS: 200.0000 mg | ORAL_TABLET | Freq: Two times a day (BID) | ORAL | 0 refills | Status: AC

## 2021-04-02 MED ORDER — POTASSIUM CHLORIDE CRYS ER 20 MEQ PO TBCR
40.0000 meq | EXTENDED_RELEASE_TABLET | Freq: Once | ORAL | Status: AC
  Administered 2021-04-02: 40 meq via ORAL
  Filled 2021-04-02: qty 2

## 2021-04-02 MED ORDER — ACETAMINOPHEN 500 MG PO TABS
1000.0000 mg | ORAL_TABLET | Freq: Once | ORAL | Status: AC
  Administered 2021-04-02: 1000 mg via ORAL
  Filled 2021-04-02: qty 2

## 2021-04-02 NOTE — ED Triage Notes (Signed)
Pt arrives to ED from home via PoV with c/c of right side chest pain, SoB and dry/ unproductive cough. Sx began on Wednesday and have been intermittent. Pain with deep inspriation started at approx 0100 this morning. Upon arrival, pt A&Ox4. Denies F/C, NVD.

## 2021-04-02 NOTE — ED Provider Notes (Signed)
[facility] Emergency Department Provider Note  ____________________________________________   Event Date/Time   First MD Initiated Contact with Patient 04/02/21 [phone]     (approximate)  I have reviewed the triage vital signs and the nursing notes.   HISTORY  Chief Complaint Chest Pain, Shortness of Breath, and Cough    HPI Colleen Sanchez is a 45 y.o. female with known brain aneurysm who comes in with chest pain, shortness of breath and cough.  Patient states that her symptoms began on Wednesday and have been intermittent.  Patient states that she initially does feel some fatigue and a little bit of cough and congestion.  However today around 1 AM she started having right-sided chest pain that was worse with taking a deep breath, associate with some shortness of breath, constant, nothing makes it better.  Denies taking any medications for this.  Denies any history of blood clots, unilateral leg swelling, hemoptysis.  Does have a Mirena IUD.  She does report that her child was also sick the last few days with a cough.  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  She is only had 1 vaccine.  Last had COVID in August.  Patient denies any weakness on one side of body, headaches.          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  Prior to Admission medications   Not on File    Allergies Shellfish allergy  No family history on file.  Social History Social History   Tobacco Use   Smoking status: Never   Smokeless tobacco: Never   Substance Use Topics   Alcohol use: Not Currently   Drug use: Not Currently      Review of Systems Constitutional: No fever/chills Eyes: No visual changes. ENT: No sore throat. Cardiovascular: Positive chest pain Respiratory: Positive shortness of breath, cough Gastrointestinal: No abdominal pain.  No nausea, no vomiting.  No diarrhea.  No constipation. Genitourinary: Negative for dysuria. Musculoskeletal: Negative for back pain. Skin: Negative for rash. Neurological: Negative for headaches, focal weakness or numbness. All other ROS negative ____________________________________________   PHYSICAL EXAM:  VITAL SIGNS: ED Triage Vitals  Enc Vitals Group     BP 04/02/21 0351 (!) 109/93     Pulse Rate 04/02/21 0351 (!) 111     Resp 04/02/21 0351 20     Temp 04/02/21 0351 98.4 F (36.9 C)     Temp Source 04/02/21 0351 Oral     SpO2 04/02/21 0351 100 %     Weight 04/02/21 0352 125 lb (56.7 kg)     Height 04/02/21 0352 5\' 4"  (1.626 m)     Head Circumference --      Peak Flow --      Pain Score 04/02/21 0404 6     Pain Loc --      Pain Edu? --      Excl. in GC? --     Constitutional: Alert and oriented. Well appearing and in no acute distress. Eyes: Conjunctivae are  normal. EOMI. Head: Atraumatic. Nose: No congestion/rhinnorhea. Mouth/Throat: Mucous membranes are moist.   Neck: No stridor. Trachea Midline. FROM Cardiovascular: Normal rate, regular rhythm. Grossly normal heart sounds.  Good peripheral circulation.  Some mild right chest wall tenderness Respiratory: Normal respiratory effort.  No retractions. Lungs CTAB. Gastrointestinal: Soft and nontender. No distention. No abdominal bruits.  Musculoskeletal: No lower extremity tenderness nor edema.  No joint effusions. Neurologic:  Normal speech and language. No gross focal neurologic deficits are appreciated.  Skin:  Skin is warm, dry and intact. No rash noted. Psychiatric: Mood and affect are normal. Speech and  behavior are normal. GU: Deferred   ____________________________________________   LABS (all labs ordered are listed, but only abnormal results are displayed)  Labs Reviewed  BASIC METABOLIC PANEL - Abnormal; Notable for the following components:      Result Value   Potassium 3.2 (*)    Glucose, Bld 108 (*)    All other components within normal limits  CBC - Abnormal; Notable for the following components:   RBC 3.76 (*)    Hemoglobin 11.7 (*)    HCT 34.0 (*)    All other components within normal limits  POC URINE PREG, ED  TROPONIN I (HIGH SENSITIVITY)  TROPONIN I (HIGH SENSITIVITY)   ____________________________________________   ED ECG REPORT I, Concha Se, the attending physician, personally viewed and interpreted this ECG.  Sinus tachycardia rate of 109, no ST elevation, no T wave versions, normal intervals ____________________________________________  RADIOLOGY Vela Prose, personally viewed and evaluated these images (plain radiographs) as part of my medical decision making, as well as reviewing the written report by the radiologist.  ED MD interpretation: Infiltrates at the lung bases  Official radiology report(s): DG Chest 2 View  Result Date: 04/02/2021 CLINICAL DATA:  Chest pain on the right with deep breaths. EXAM: CHEST - 2 VIEW COMPARISON:  08/31/2012 FINDINGS: Reticulonodular opacity in the lower lungs bilaterally. No edema, effusion, or pneumothorax. Normal heart size and mediastinal contours. IMPRESSION: Infiltrates at the lung bases. Electronically Signed   By: Marnee Spring M.D.   On: 04/02/2021 04:51    ____________________________________________   PROCEDURES  Procedure(s) performed (including Critical Care):  .1-3 Lead EKG Interpretation  Date/Time: 04/02/2021 8:52 AM Performed by: Concha Se, MD Authorized by: Concha Se, MD     Interpretation: normal     ECG rate:  90s   ECG rate assessment: normal     Rhythm: sinus rhythm      Ectopy: none     Conduction: normal     ____________________________________________   INITIAL IMPRESSION / ASSESSMENT AND PLAN / ED COURSE   Colleen Sanchez was evaluated in Emergency Department on 04/02/2021 for the symptoms described in the history of present illness. She was evaluated in the context of the global COVID-19 pandemic, which necessitated consideration that the patient might be at risk for infection with the SARS-CoV-2 virus that causes COVID-19. Institutional protocols and algorithms that pertain to the evaluation of patients at risk for COVID-19 are in a state of rapid change based on information released by regulatory bodies including the CDC and federal and state organizations. These policies and algorithms were followed during the patient's care in the ED.    Most Likely DDx:  -Given patient's positive sick contacts I suspect this is most likely COVID or flu.  Will get testing to further evaluate.  He does think about the possibility of PE given some mild tachycardia when  she initially got here however her tachycardia is now resolved.  Patient remains on the cardiac monitor.  She has no other symptoms of PE and my suspicion is low with the COVID or flu test is positive.  Cardiac marker was ordered to evaluate for ACS   DDx that was also considered d/t potential to cause harm, but was found less likely based on history and physical (as detailed above): -PNA (no fevers, cough but CXR to evaluate) -PNX (reassured with equal b/l breath sounds, CXR to evaluate) -Symptomatic anemia (will get H&H) -Pulmonary embolism as no sob at rest, not pleuritic in nature, no hypoxia -Aortic Dissection as no tearing pain and no radiation to the mid back, pulses equal -Pericarditis no rub on exam, EKG changes or hx to suggest dx -Tamponade (no notable SOB, tachycardic, hypotensive) -Esophageal rupture (no h/o diffuse vomitting/no crepitus)  Patient was given some Tylenol and Toradol to  help with pain.  Her potassium was slightly low and this was repleted  Patient feeling better after the pain medication.  Repeat troponin is negative.  D-dimer is negative and BNP is negative and procalcitonin is negative.  I have high suspicion that this is most likely COVID with just a false negative test and I did discuss this with patient how she remained quarantine for the next 5 days.  However given the chest x-ray concerning with some bilateral infiltrates also consider the possibility of a bacterial pneumonia so we will start some antibiotics.  Also could be musculoskeletal pain from the coughing.  I recommended Tylenol and ibuprofen to help with thought.  We discussed return precautions including worsening shortness of breath or any other concerns.  She expressed understanding felt comfortable with this plan  I discussed the provisional nature of ED diagnosis, the treatment so far, the ongoing plan of care, follow up appointments and return precautions with the patient and any family or support people present. They expressed understanding and agreed with the plan, discharged home.          ____________________________________________   FINAL CLINICAL IMPRESSION(S) / ED DIAGNOSES   Final diagnoses:  Community acquired pneumonia, unspecified laterality     MEDICATIONS GIVEN DURING THIS VISIT:  Medications  acetaminophen (TYLENOL) tablet 1,000 mg (1,000 mg Oral Given 04/02/21 0843)  ketorolac (TORADOL) 30 MG/ML injection 30 mg (30 mg Intramuscular Given 04/02/21 1009)  potassium chloride SA (KLOR-CON) CR tablet 40 mEq (40 mEq Oral Given 04/02/21 1008)     ED Discharge Orders          Ordered    azithromycin (ZITHROMAX Z-PAK) 250 MG tablet        04/02/21 1138    cefpodoxime (VANTIN) 200 MG tablet  2 times daily        04/02/21 1138             Note:  This document was prepared using Dragon voice recognition software and may include unintentional dictation  errors.    Concha Se, MD 04/02/21 1139

## 2021-04-02 NOTE — Discharge Instructions (Addendum)
I have high suspicion that this is most likely COVID with just a false negative test --remain quarantine for the next 5 days.  However given the chest x-ray concerning with some bilateral infiltrates also consider the possibility of a bacterial pneumonia so we will start some antibiotics.  Also could be musculoskeletal pain from the coughing.  I recommended Tylenol and ibuprofen to help with thought.  Take Tylenol 1 g every 8 hours ibuprofen 600 every 6 hours with food.    We discussed return precautions including worsening shortness of breath or any other concerns.

## 2021-04-12 ENCOUNTER — Other Ambulatory Visit: Payer: Self-pay

## 2021-04-12 ENCOUNTER — Ambulatory Visit
Admission: RE | Admit: 2021-04-12 | Discharge: 2021-04-12 | Disposition: A | Discharge status: Home / Self Care | Payer: BC Managed Care – PPO | Source: Ambulatory Visit | Attending: Neurosurgery | Admitting: Neurosurgery

## 2021-04-12 DIAGNOSIS — I671 Cerebral aneurysm, nonruptured: Secondary | ICD-10-CM | POA: Insufficient documentation

## 2021-04-12 DIAGNOSIS — G935 Compression of brain: Secondary | ICD-10-CM | POA: Diagnosis present

## 2021-04-12 IMAGING — MR MR HEAD W/O CM
11 of 12 series · 37 of 48 positions shown · non-contrast
Comparison: No pertinent prior exam.
COMPARISON: No pertinent prior exam.

CLINICAL DATA: Chiari malformation. Additional history provided by
scanning technologist: Chiari 1 malformation with upper cervical
syrinx. 3 x 5 mm left ICA ophthalmic segment ICA aneurysm.

EXAM:
MRI HEAD WITHOUT CONTRAST
MRA HEAD WITHOUT CONTRAST
TECHNIQUE: Multiplanar, multi-echo pulse sequences of the brain and surrounding
structures were acquired without intravenous contrast. Angiographic
images of the Circle of Willis were acquired using MRA technique
without intravenous contrast.

[Series 5: ax dwi_tracew · axial · 3.0mm · 0.65mm/px · z∈[-119,+47]mm · 4 of 52 slices shown]
[im 1/52]
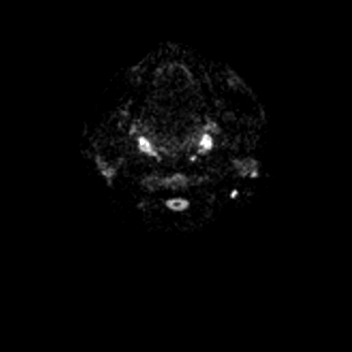
[im 18/52]
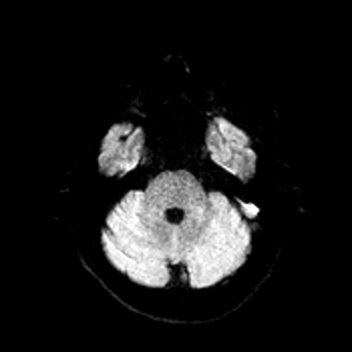
[im 35/52]
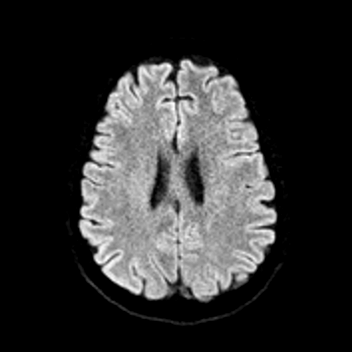
[im 52/52]
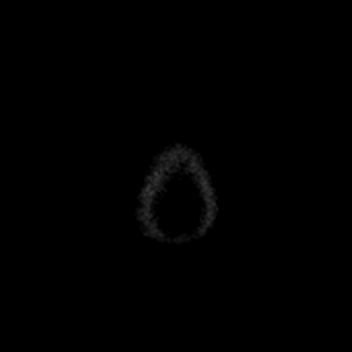

[Series 6: ax dwi_adc · axial · 3.0mm · 0.65mm/px · z∈[-119,+47]mm · 4 of 52 slices shown]
[im 1/52]
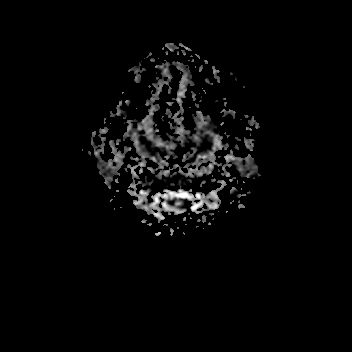
[im 18/52]
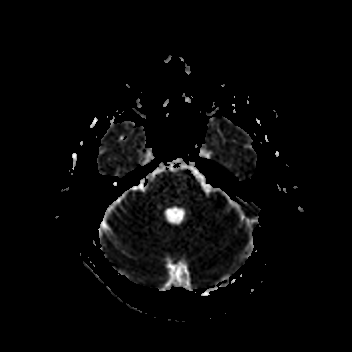
[im 35/52]
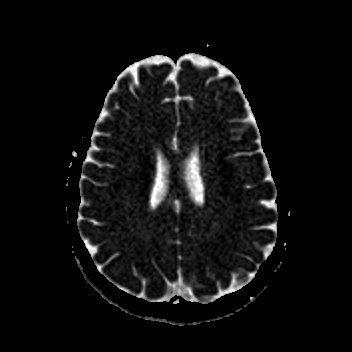
[im 52/52]
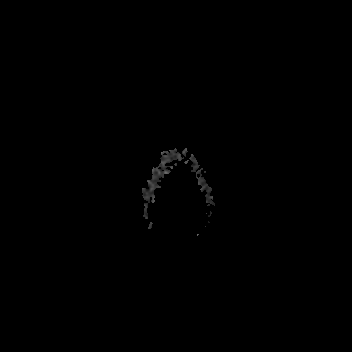

[Series 12: cor dwi_tracew · coronal · 5.0mm · 0.65mm/px · 2 of 40 slices shown]
[im 1/40]
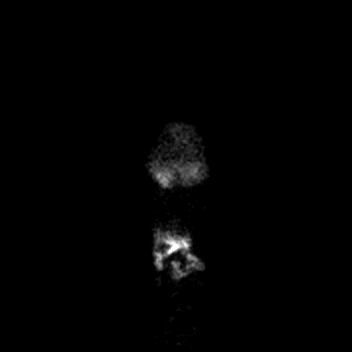
[im 40/40]
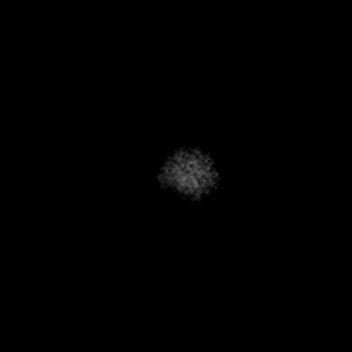

[Series 13: cor dwi_adc · coronal · 5.0mm · 0.65mm/px · 2 of 39 slices shown]
[im 1/39]
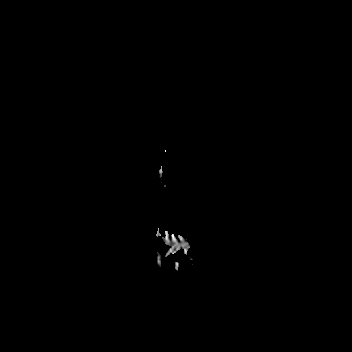
[im 39/39]
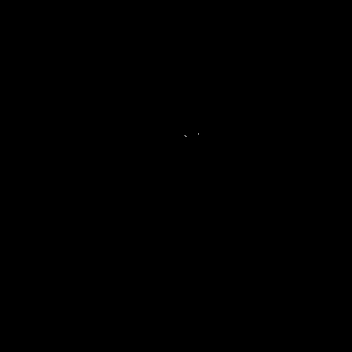

[Series 14: T1 · sagittal · 5.0mm · 0.62mm/px · 1 of 25 slices shown (1 of 2)]
[im 1/25]
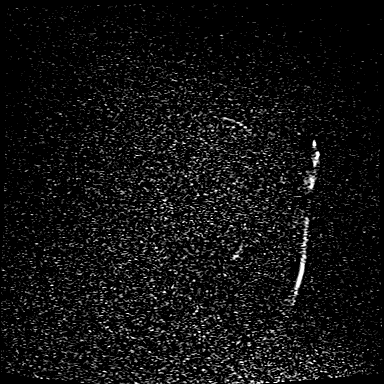

[Series 15: T2 · axial · 5.0mm · 0.53mm/px · z∈[-135,+43]mm · 2 of 31 slices shown]
[im 1/31]
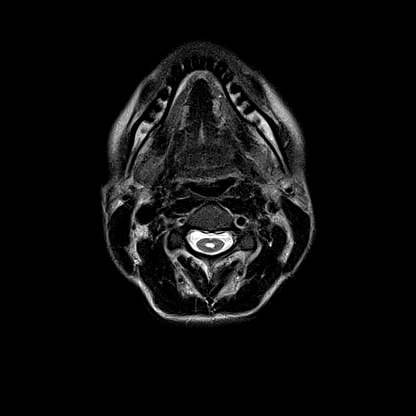
[im 31/31]
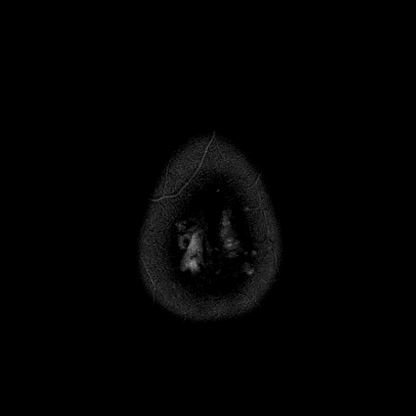

[Series 16: mag_images · axial · 3.0mm · 0.90mm/px · z∈[-134,+41]mm · 3 of 60 slices shown]
[im 1/60]
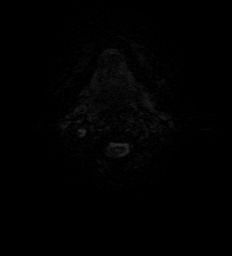
[im 30/60]
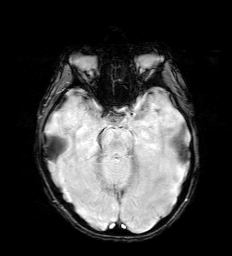
[im 60/60]
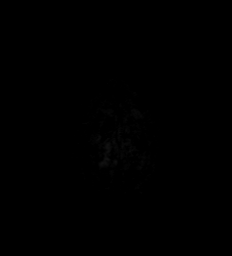

[Series 17: pha_images · axial · 3.0mm · 0.90mm/px · z∈[-134,+41]mm · 3 of 60 slices shown]
[im 1/60]
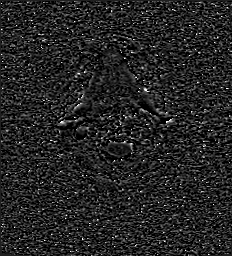
[im 30/60]
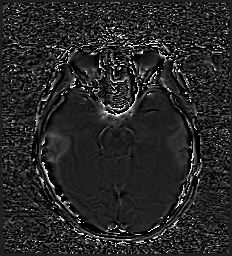
[im 60/60]
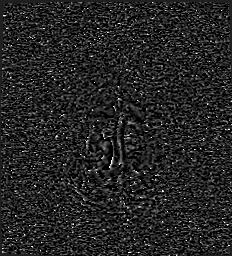

[Series 18: swi_images · axial · 3.0mm · 0.90mm/px · z∈[-134,+41]mm · 3 of 60 slices shown]
[im 1/60]
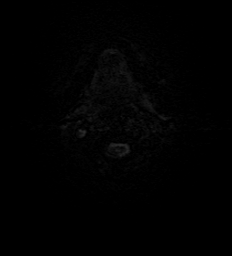
[im 30/60]
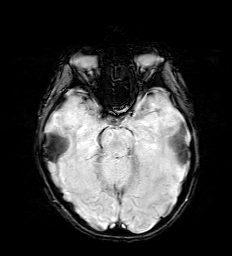
[im 60/60]
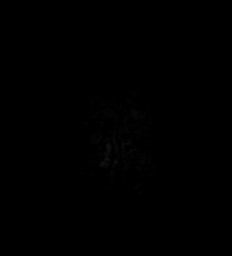

[Series 20: FLAIR · axial · 3.0mm · 0.53mm/px · z∈[-132,+40]mm · 3 of 59 slices shown]
[im 1/59]
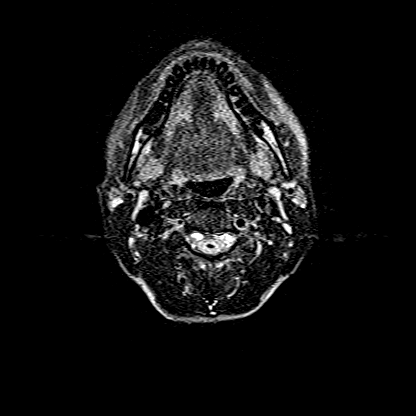
[im 30/59]
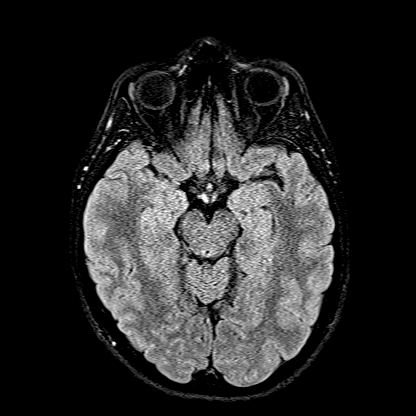
[im 59/59]
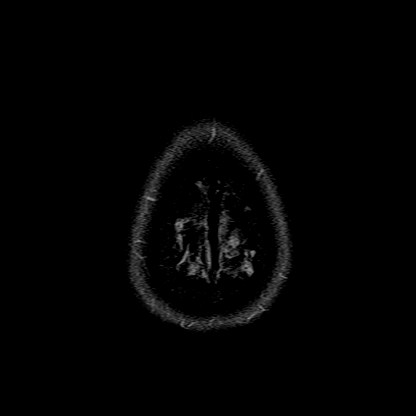

[Series 21: T1 · axial · 1.0mm · 0.98mm/px · z∈[-133,+40]mm · 10 of 176 slices shown (2 of 2)]
[im 1/176]
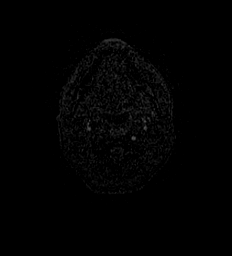
[im 20/176]
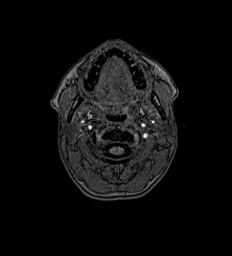
[im 39/176]
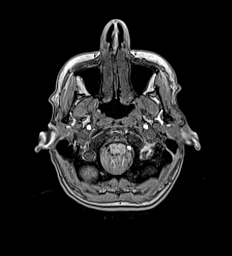
[im 59/176]
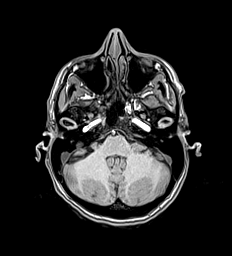
[im 78/176]
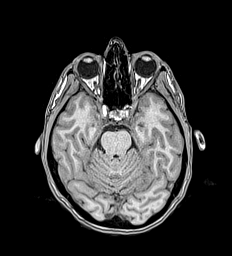
[im 98/176]
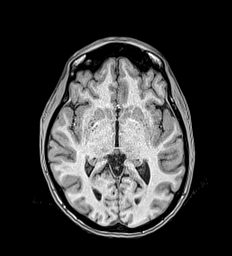
[im 117/176]
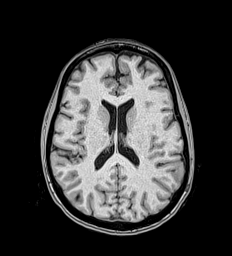
[im 137/176]
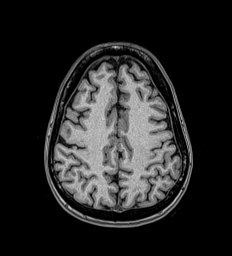
[im 156/176]
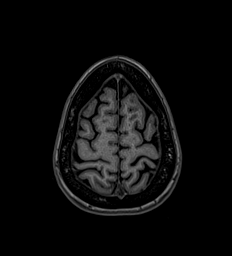
[im 176/176]
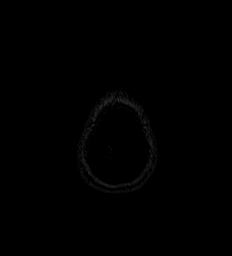

[37 of 48 positions shown; findings below may reference images not displayed]

FINDINGS: MRI HEAD FINDINGS

Brain:

Cerebral volume is normal.

Redemonstrated Chiari 1 malformation. As before, the cerebellar
tonsils demonstrate a peg-like configuration and extend 15 mm below
the level of foramen magnum (to the level of the C1 posterior arch).
Unchanged crowding at the level of the foramen magnum. No
hydrocephalus. Associated syrinx within the upper cervical spinal
cord (at the C2-C3 levels), incompletely assessed on the sagittal T1
weighted sequence, but grossly unchanged as compared to the prior
brain MRI of [DATE].

No cortical encephalomalacia is identified. No significant white
matter disease.

There is no acute infarct.

No evidence of intracranial mass.

No chronic intracranial blood products.

No extra-axial fluid collection.

No midline shift.

Vascular: Maintained proximal arterial flow voids. Suspected left
parietal lobe developmental venous anomaly (an anatomic variant).

Skull and upper cervical spine: No focal marrow lesion.

Sinuses/Orbits: Visualized orbits show no acute finding. Trace
bilateral ethmoid sinus mucosal thickening.

MRA HEAD FINDINGS

Anterior circulation:

The intracranial internal carotid arteries are patent. The M1 middle
cerebral arteries are patent. No M2 proximal branch occlusion or
high-grade proximal stenosis is identified. The anterior cerebral
arteries are patent.

4 x 5 mm laterally projecting aneurysm arising from the paraclinoid
left internal carotid artery, unchanged as compared to the MRA head
of [DATE] (series 7, image 116) (series [AX], image 161)
(remeasured on prior).

Additional 2 mm inferomedially projecting vascular protrusion
arising from the paraclinoid left ICA (at essentially the same
level) compatible with aneurysm (as seen on series [AX], image 164)
(series 7, image 113). This was present on the prior MRA head of
[DATE] and is unchanged in size as compared to this prior exam.

Posterior circulation:

The intracranial right vertebral artery is non dominant and
terminates predominantly as the right PICA. The dominant
intracranial left vertebral artery is patent without stenosis. The
basilar artery is patent. The posterior cerebral arteries are
patent.

Anatomic variants: A left posterior communicating artery is present.
A right posterior communicating artery is not definitively
identified.
IMPRESSION: MRI brain:

1. No acute intracranial abnormality.
2. Chiari 1 malformation with associated upper cervical syrinx, as
described and not appreciably changed from the brain MRI of
[DATE].

MRA head:

1. 4 x 5 mm laterally projecting aneurysm arising from the
paraclinoid left internal carotid artery, unchanged from the MRA
head of [DATE].
2. Additional 2 mm inferomedially projecting aneurysm arising from
the cavernous left internal carotid artery (at essentially the same
level), also unchanged.

## 2022-01-12 ENCOUNTER — Encounter: Payer: Self-pay | Admitting: Emergency Medicine

## 2022-01-12 ENCOUNTER — Ambulatory Visit
Admission: EM | Admit: 2022-01-12 | Discharge: 2022-01-12 | Disposition: A | Discharge status: Home / Self Care | Payer: BC Managed Care – PPO

## 2022-01-12 ENCOUNTER — Other Ambulatory Visit: Payer: Self-pay

## 2022-01-12 DIAGNOSIS — J302 Other seasonal allergic rhinitis: Secondary | ICD-10-CM

## 2022-01-12 DIAGNOSIS — R051 Acute cough: Secondary | ICD-10-CM

## 2022-01-12 NOTE — ED Provider Notes (Signed)
?[facility] ? ? ? ?CSN: [id number] ?Arrival date & time: 01/12/22  [id number] ? ? ?  ? ?History   ?Chief Complaint ?Chief Complaint  ?Patient presents with  ? Nasal Congestion  ? Cough  ? ? ?HPI ?Colleen Sanchez is a 46 y.o. female.  Patient presents with 2-day history of runny nose, postnasal drip, sinus congestion, chest congestion, nonproductive cough.  She reports history of pneumonia last summer and would like her lungs evaluated.  She denies fever, chills, shortness of breath, chest pain, or other symptoms.  Treatment at home with [facility].  Her medical history includes brain aneurysm, Chiari malformation. ? ?The history is provided by the patient and medical records.  ? ?Past Medical History:  ?Diagnosis Date  ? Known health problems: none   ? ? ?Patient Active Problem List  ? Diagnosis Date Noted  ? Acute headache 03/31/2020  ? Chiari malformation type I ([place]) 03/31/2020  ? Acute confusion 03/31/2020  ? Acute focal neurological deficit 03/31/2020  ? 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 Hypokalemia 03/31/2020  ? Headache 03/31/2020  ? ? ?History reviewed. No pertinent surgical history. ? ?OB History   ?No obstetric history on file. ?  ? ? ? ?Home Medications   ? ?Prior to Admission medications   ?Medication Sig Start Date End Date Taking? Authorizing Provider  ?fexofenadine (ALLEGRA) 180 MG tablet Take by mouth.    [provider]  ?levonorgestrel (MIRENA) 20 MCG/DAY IUD by Intrauterine route.    [provider]  ? ? ?Family History ?History reviewed. No pertinent family history. ? ?Social History ?Social History  ? ?Tobacco Use  ? Smoking status: Never  ? Smokeless tobacco: Never  ?Vaping Use  ? Vaping Use: Never used  ?Substance Use Topics  ? Alcohol use: Not Currently  ? Drug use: Not Currently  ? ? ? ?Allergies   ?Shellfish allergy ? ? ?Review of Systems ?Review of Systems  ?Constitutional:  Negative for chills and fever.  ?HENT:  Positive for  congestion, postnasal drip, rhinorrhea and sinus pressure. Negative for ear pain and sore throat.   ?Respiratory:  Positive for cough. Negative for shortness of breath.   ?Cardiovascular:  Negative for chest pain and palpitations.  ?Gastrointestinal:  Negative for diarrhea and vomiting.  ?All other systems reviewed and are negative. ? ? ?Physical Exam ?Triage Vital Signs ?ED Triage Vitals  ?Enc Vitals Group  ?   BP 01/12/22 1009 116/79  ?   Pulse Rate 01/12/22 1009 71  ?   Resp 01/12/22 1009 16  ?   Temp 01/12/22 1009 98.2 ?F (36.8 ?C)  ?   Temp Source 01/12/22 1009 Oral  ?   SpO2 01/12/22 1009 98 %  ?   Weight --   ?   Height --   ?   Head Circumference --   ?   Peak Flow --   ?   Pain Score 01/12/22 1006 0  ?   Pain Loc --   ?   Pain Edu? --   ?   Excl. in [place]? --   ? ?No data found. ? ?Updated Vital Signs ?BP 116/79 (BP Location: Left Arm)   Pulse 71   Temp 98.2 ?F (36.8 ?C) (Oral)   Resp 16   SpO2 98%  ? ?Visual Acuity ?Right Eye Distance:   ?Left Eye Distance:   ?Bilateral Distance:   ? ?Right Eye Near:   ?Left Eye Near:    ?  Bilateral Near:    ? ?Physical Exam ?Vitals and nursing note reviewed.  ?Constitutional:   ?   General: She is not in acute distress. ?   Appearance: Normal appearance. She is well-developed. She is not ill-appearing.  ?HENT:  ?   Right Ear: Tympanic membrane normal.  ?   Left Ear: Tympanic membrane normal.  ?   Nose: Rhinorrhea present.  ?   Mouth/Throat:  ?   Mouth: Mucous membranes are moist.  ?   Pharynx: Oropharynx is clear.  ?Cardiovascular:  ?   Rate and Rhythm: Normal rate and regular rhythm.  ?   Heart sounds: Normal heart sounds.  ?Pulmonary:  ?   Effort: Pulmonary effort is normal. No respiratory distress.  ?   Breath sounds: Normal breath sounds.  ?Musculoskeletal:  ?   Cervical back: Neck supple.  ?Skin: ?   General: Skin is warm and dry.  ?Neurological:  ?   Mental Status: She is alert.  ?Psychiatric:     ?   Mood and Affect: Mood normal.     ?   Behavior: Behavior normal.   ? ? ? ?UC Treatments / Results  ?Labs ?(all labs ordered are listed, but only abnormal results are displayed) ?Labs Reviewed - No data to display ? ?EKG ? ? ?Radiology ?No results found. ? ?Procedures ?Procedures (including critical care time) ? ?Medications Ordered in UC ?Medications - No data to display ? ?Initial Impression / Assessment and Plan / UC Course  ?I have reviewed the triage vital signs and the nursing notes. ? ?Pertinent labs & imaging results that were available during my care of the patient were reviewed by me and considered in my medical decision making (see chart for details). ? ?Cough, seasonal allergies.  Lungs are clear and O2 sat 98% on room air.  Patient declines COVID test.  She has been taking Allegra for her symptoms.  Suggested Flonase nasal spray and Zyrtec for symptomatic treatment.  Instructed patient to follow up with her PCP if her symptoms are not improving.  She agrees to plan of care.  ? ? ? ?Final Clinical Impressions(s) / UC Diagnoses  ? ?Final diagnoses:  ?Acute cough  ?Seasonal allergies  ? ? ? ?Discharge Instructions   ? ?  ?Take Zyrtec and use Flonase nasal spray as directed.  Follow up with your primary care provider if your symptoms are not improving.   ? ? ? ? ? ?ED Prescriptions   ?None ?  ? ?PDMP not reviewed this encounter. ?  ?Sharion Balloon, NP ?01/12/22 1105 ? ?

## 2022-01-12 NOTE — ED Triage Notes (Signed)
Pt presents with cough, runny nose, chest congestion since yesterday.  ?

## 2022-01-12 NOTE — Discharge Instructions (Addendum)
Take Zyrtec and use Flonase nasal spray as directed.  Follow up with your primary care provider if your symptoms are not improving.   ? ?

## 2022-01-26 ENCOUNTER — Other Ambulatory Visit (HOSPITAL_COMMUNITY): Payer: Self-pay | Admitting: Neurosurgery

## 2022-01-26 ENCOUNTER — Other Ambulatory Visit: Payer: Self-pay | Admitting: Neurosurgery

## 2022-01-26 DIAGNOSIS — I671 Cerebral aneurysm, nonruptured: Secondary | ICD-10-CM

## 2022-02-05 ENCOUNTER — Other Ambulatory Visit: Payer: Self-pay | Admitting: Neurosurgery

## 2022-02-05 DIAGNOSIS — I671 Cerebral aneurysm, nonruptured: Secondary | ICD-10-CM

## 2022-02-22 ENCOUNTER — Ambulatory Visit
Admission: RE | Admit: 2022-02-22 | Discharge: 2022-02-22 | Disposition: A | Discharge status: Home / Self Care | Payer: BC Managed Care – PPO | Source: Ambulatory Visit | Attending: Neurosurgery | Admitting: Neurosurgery

## 2022-02-22 DIAGNOSIS — I671 Cerebral aneurysm, nonruptured: Secondary | ICD-10-CM | POA: Diagnosis not present

## 2022-02-22 IMAGING — MR MR MRA HEAD W/O CM
1 series · 27 of 48 positions shown · non-contrast
Comparison: MRA head [DATE].

CLINICAL DATA: Cerebral aneurysm.

EXAM:
MRA HEAD WITHOUT CONTRAST
TECHNIQUE: Angiographic images of the Circle of Willis were acquired using MRA
technique without intravenous contrast.

[Series 5: TOF · axial · 0.5mm · 0.48mm/px · z∈[-126,-31]mm · 27 of 217 slices shown]
[im 1/217]
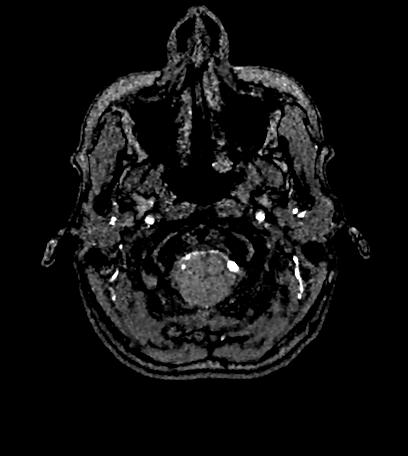
[im 5/217]
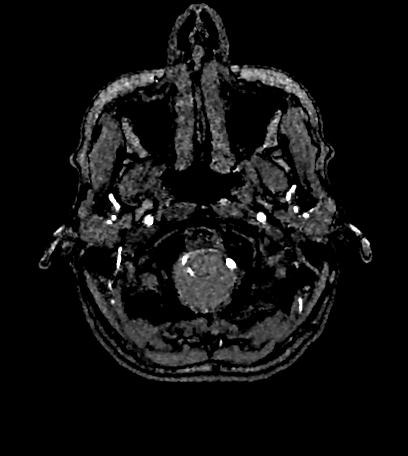
[im 10/217]
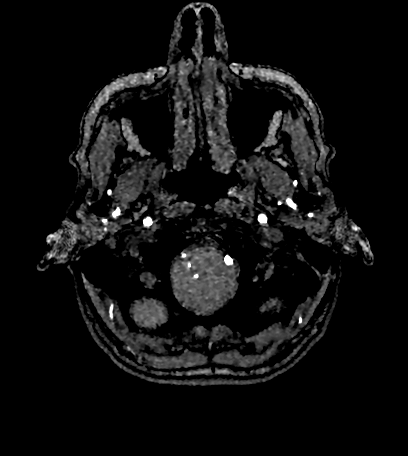
[im 14/217]
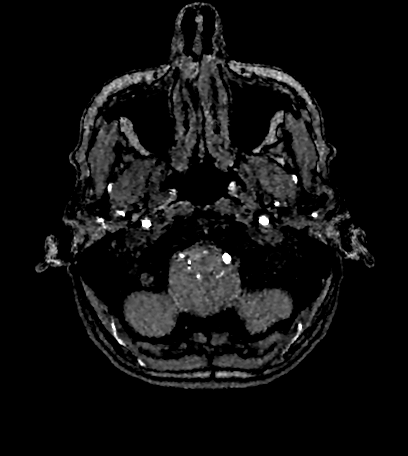
[im 19/217]
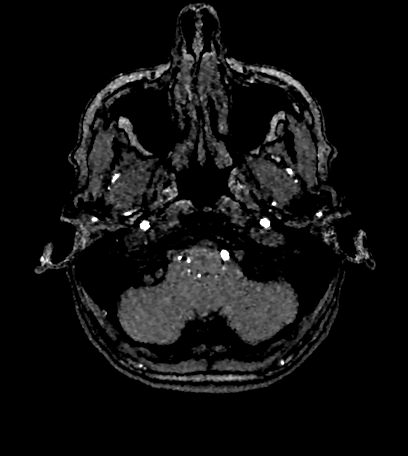
[im 23/217]
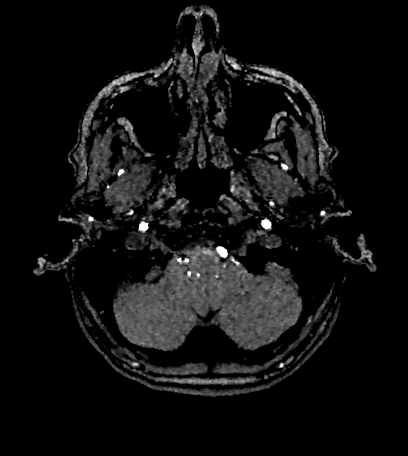
[im 28/217]
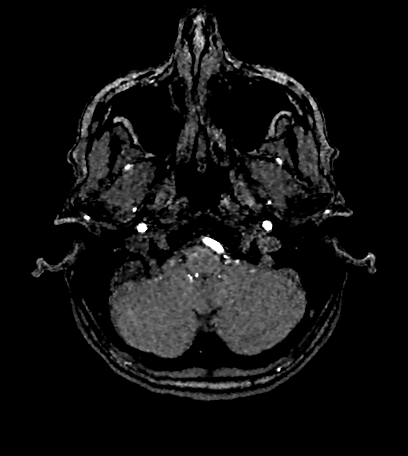
[im 33/217]
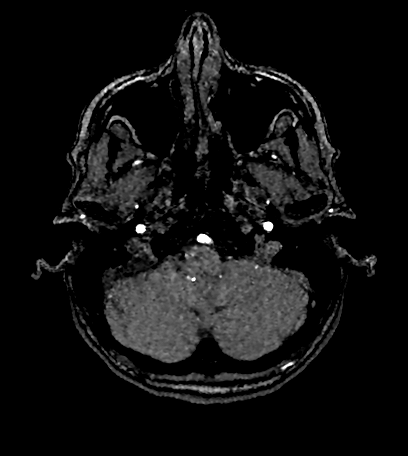
[im 37/217]
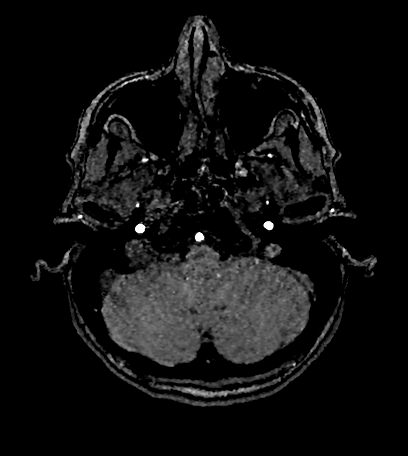
[im 42/217]
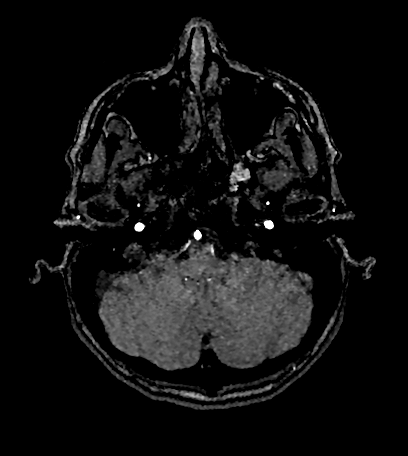
[im 46/217]
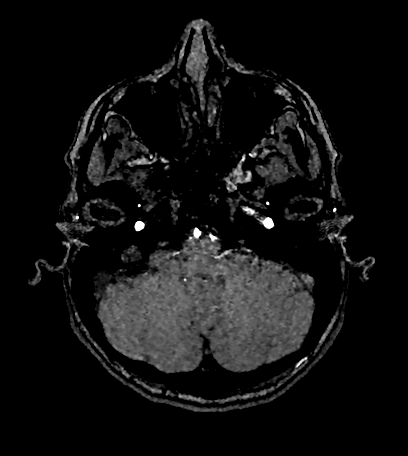
[im 51/217]
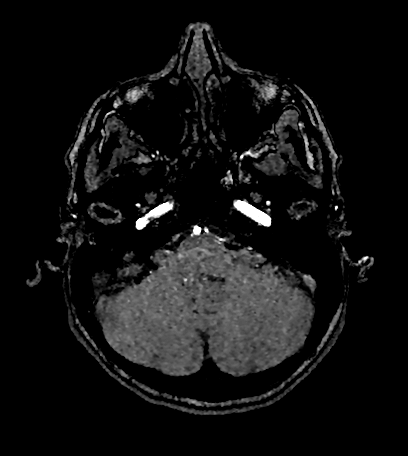
[im 56/217]
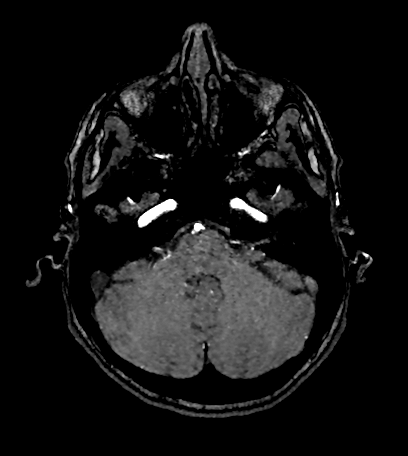
[im 60/217]
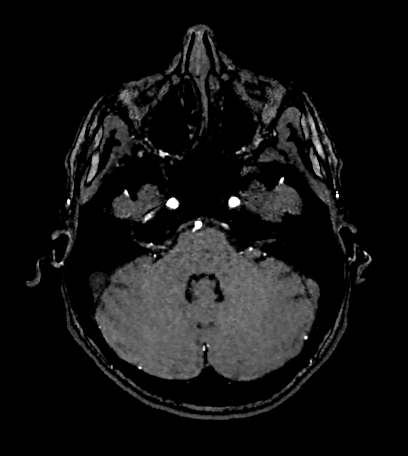
[im 65/217]
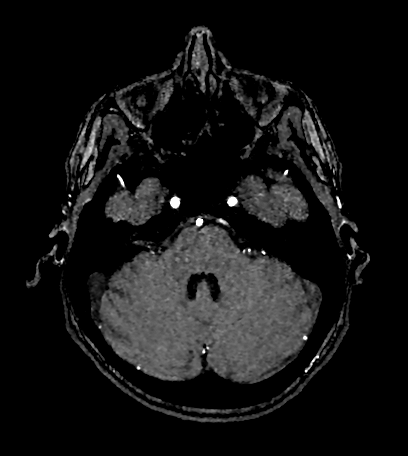
[im 69/217]
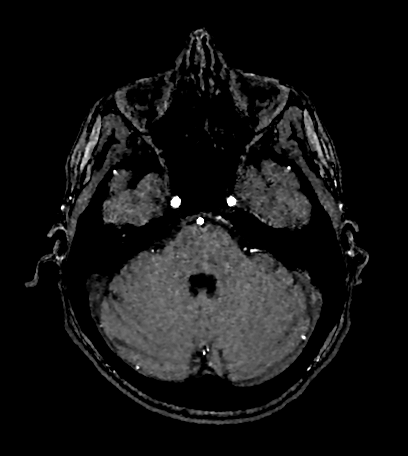
[im 74/217]
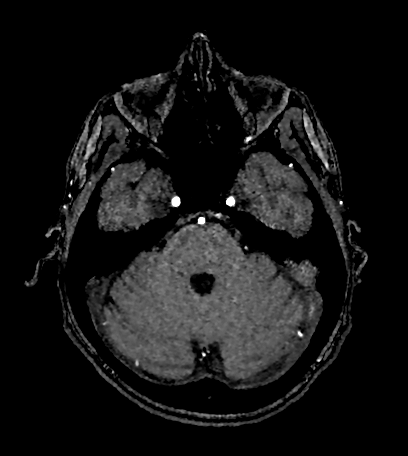
[im 79/217]
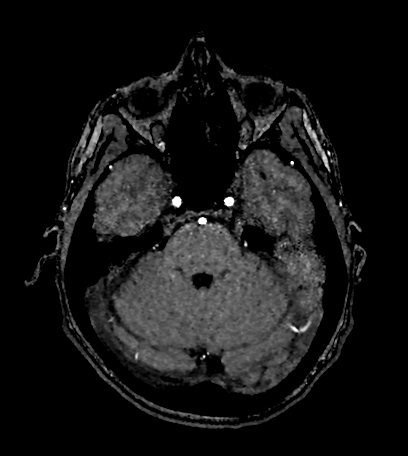
[im 83/217]
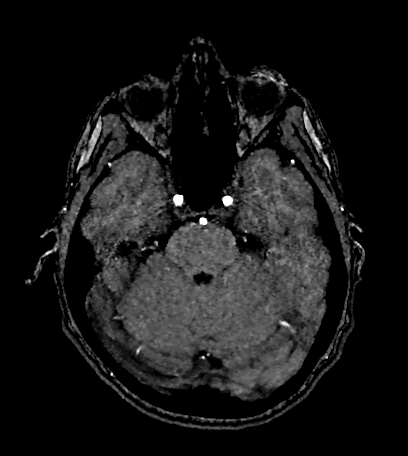
[im 88/217]
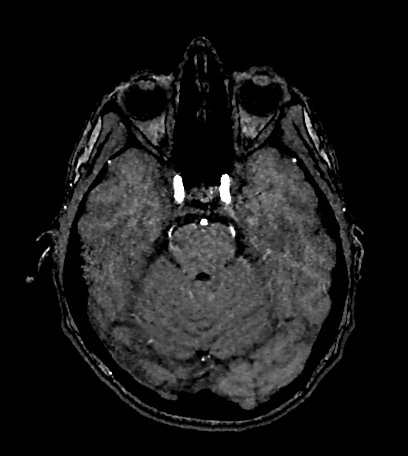
[im 97/217]
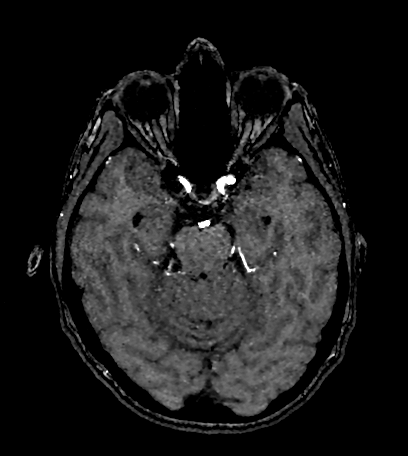
[im 111/217]
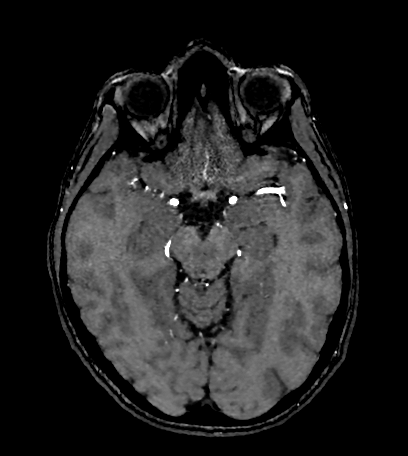
[im 125/217]
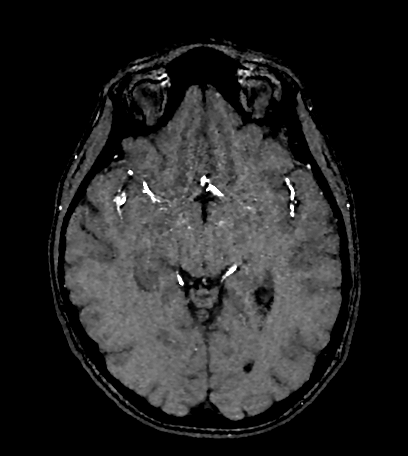
[im 152/217]
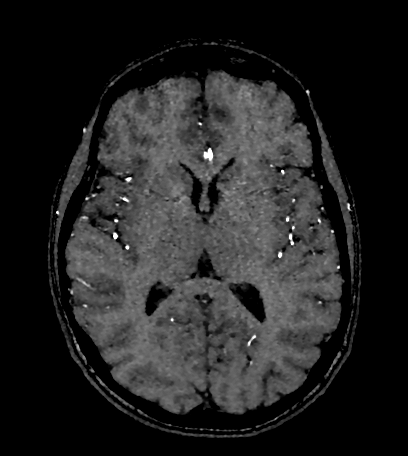
[im 180/217]
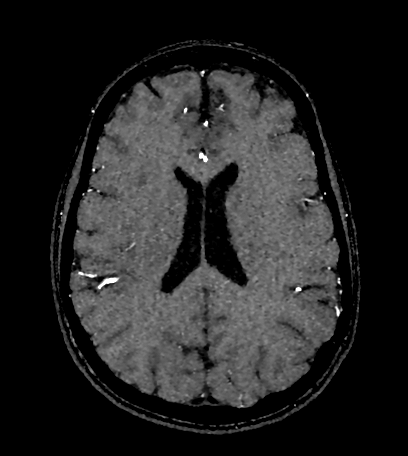
[im 184/217]
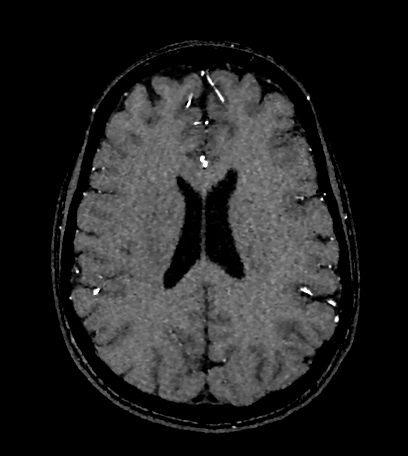
[im 207/217]
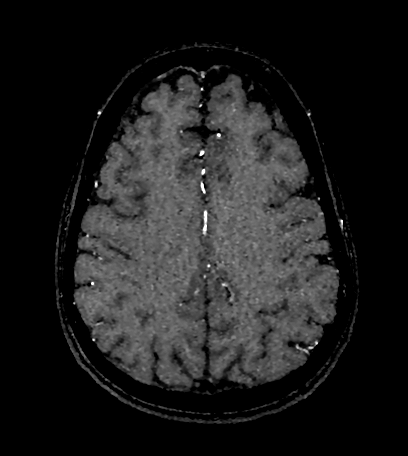

[27 of 48 positions shown; findings below may reference images not displayed]

FINDINGS: Anterior circulation:

The intracranial internal carotid arteries are patent. The M1 middle
cerebral arteries are patent. No M2 proximal branch occlusion or
high-grade proximal stenosis is identified. The anterior cerebral
arteries are patent.

4 x 5 mm laterally projecting aneurysm arising from the paraclinoid
left internal carotid artery, unchanged in size from the prior MRA
head of [DATE].

2 mm inferomedially projecting vascular protrusion arising from the
distal cavernous/paraclinoid left ICA, also unchanged in size from
the prior MRA (series 5, image 93) (series [NO], image 185).

No new intracranial aneurysm is identified.

Posterior circulation:

The right vertebral artery is non-dominant and terminates
predominantly, or completely, as the right PICA. The dominant
intracranial left vertebral artery is patent without stenosis. The
basilar artery is patent. The posterior cerebral arteries are
patent. A left posterior communicating artery is present. The right
posterior communicating artery is diminutive or absent.

Anatomic variants: As described.
IMPRESSION: 4 x 5 mm laterally projecting aneurysm arising from the paraclinoid
left ICA, unchanged in size from the prior MRA head of [DATE].

2 mm inferomedially projecting aneurysm arising from the distal
cavernous/paraclinoid left ICA, also unchanged in size.

## 2022-02-23 ENCOUNTER — Ambulatory Visit: Payer: BC Managed Care – PPO

## 2024-04-06 LAB — COLOGUARD: COLOGUARD: NEGATIVE

## 2024-04-17 ENCOUNTER — Other Ambulatory Visit: Payer: Self-pay | Admitting: Neurosurgery

## 2024-04-17 DIAGNOSIS — I671 Cerebral aneurysm, nonruptured: Secondary | ICD-10-CM

## 2024-04-22 ENCOUNTER — Ambulatory Visit
Admission: RE | Admit: 2024-04-22 | Discharge: 2024-04-22 | Disposition: A | Discharge status: Home / Self Care | Source: Ambulatory Visit | Attending: Neurosurgery | Admitting: Neurosurgery

## 2024-04-22 DIAGNOSIS — I671 Cerebral aneurysm, nonruptured: Secondary | ICD-10-CM | POA: Diagnosis present
